# Patient Record
Sex: Male | Born: 1945 | Race: White | Hispanic: No | Marital: Married | Smoking: Former smoker
Health system: Southern US, Community
[De-identification: ages and names within clinical notes are randomized; demographics above are authoritative.]

## PROBLEM LIST (undated history)

## (undated) DIAGNOSIS — M545 Low back pain, unspecified: Secondary | ICD-10-CM

## (undated) DIAGNOSIS — E785 Hyperlipidemia, unspecified: Secondary | ICD-10-CM

## (undated) DIAGNOSIS — M1990 Unspecified osteoarthritis, unspecified site: Secondary | ICD-10-CM

## (undated) DIAGNOSIS — I639 Cerebral infarction, unspecified: Secondary | ICD-10-CM

## (undated) DIAGNOSIS — I10 Essential (primary) hypertension: Secondary | ICD-10-CM

## (undated) DIAGNOSIS — I723 Aneurysm of iliac artery: Secondary | ICD-10-CM

## (undated) DIAGNOSIS — F17210 Nicotine dependence, cigarettes, uncomplicated: Secondary | ICD-10-CM

## (undated) HISTORY — DX: Hyperlipidemia, unspecified: E78.5

## (undated) HISTORY — DX: Cerebral infarction, unspecified: I63.9

## (undated) HISTORY — DX: Unspecified osteoarthritis, unspecified site: M19.90

## (undated) HISTORY — DX: Low back pain, unspecified: M54.50

## (undated) HISTORY — DX: Essential (primary) hypertension: I10

## (undated) HISTORY — DX: Low back pain: M54.5

## (undated) HISTORY — DX: Aneurysm of iliac artery: I72.3

## (undated) HISTORY — DX: Nicotine dependence, cigarettes, uncomplicated: F17.210

---

## 2012-11-09 HISTORY — PX: CHOLECYSTECTOMY: SHX55

## 2017-09-06 ENCOUNTER — Other Ambulatory Visit: Payer: Self-pay

## 2017-09-06 DIAGNOSIS — I723 Aneurysm of iliac artery: Secondary | ICD-10-CM

## 2017-09-09 DIAGNOSIS — I723 Aneurysm of iliac artery: Secondary | ICD-10-CM

## 2017-09-09 HISTORY — DX: Aneurysm of iliac artery: I72.3

## 2017-09-15 ENCOUNTER — Encounter: Payer: Self-pay | Admitting: Vascular Surgery

## 2017-09-29 ENCOUNTER — Encounter: Payer: Self-pay | Admitting: Vascular Surgery

## 2017-09-29 ENCOUNTER — Ambulatory Visit (HOSPITAL_COMMUNITY)
Admission: RE | Admit: 2017-09-29 | Discharge: 2017-09-29 | Disposition: A | Discharge status: Home / Self Care | Payer: Medicare HMO | Source: Ambulatory Visit | Attending: Vascular Surgery | Admitting: Vascular Surgery

## 2017-09-29 ENCOUNTER — Ambulatory Visit (INDEPENDENT_AMBULATORY_CARE_PROVIDER_SITE_OTHER): Payer: Medicare HMO | Admitting: Vascular Surgery

## 2017-09-29 VITALS — BP 134/57 | HR 65 | Temp 98.1°F | Resp 16 | Ht 73.0 in | Wt 221.0 lb

## 2017-09-29 DIAGNOSIS — I713 Abdominal aortic aneurysm, ruptured, unspecified: Secondary | ICD-10-CM

## 2017-09-29 DIAGNOSIS — I714 Abdominal aortic aneurysm, without rupture, unspecified: Secondary | ICD-10-CM

## 2017-09-29 DIAGNOSIS — I723 Aneurysm of iliac artery: Secondary | ICD-10-CM

## 2017-09-29 NOTE — Progress Notes (Signed)
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  The consult is requested by Dr. Leona CarryImran.  HPI:   George Salazar is a pleasant 71 y.o. male, who underwent a screening ultrasound that showed a large right common iliac artery aneurysm and a small abdominal aortic aneurysm.  He is sent for vascular consultation.  He denies any family history of aneurysmal disease.  He denies any abdominal pain or back pain.  He is a smoker.  He denies any history of claudication, rest pain, or nonhealing ulcers.  I reviewed the ultrasound screening that was done elsewhere.  This showed that the maximum diameter of the infrarenal aorta was 3.2 cm.  The right common iliac artery measured 3.7 cm in maximal diameter, and the left common iliac artery 1.1 cm in diameter.  I reviewed the records that were sent from the referring office.  Patient was seen on 08/25/2017.  This was for an annual wellness check.  The hypertension was under good control.  Hyperlipidemia was also under good control.  Hemoglobin A1c was 5.5.  Creatinine was 1.9.  LDL was 64.  Past Medical History:  Diagnosis Date  . Hyperlipidemia   . Hypertension   . OA (osteoarthritis)     No family history on file.   There is no family history of aneurysmal disease.  There is no family history of premature cardiovascular disease.  SOCIAL HISTORY: He has smoked over a pack per day of cigarettes for many years but is cutting back now.  Social History   Socioeconomic History  . Marital status: Married    Spouse name: Not on file  . Number of children: Not on file  . Years of education: Not on file  . Highest education level: Not on file  Social Needs  . Financial resource strain: Not on file  . Food insecurity - worry: Not on file  . Food insecurity - inability: Not on file  . Transportation needs - medical: Not on file  . Transportation needs - non-medical: Not  on file  Occupational History  . Not on file  Tobacco Use  . Smoking status: Current Some Day Smoker  . Smokeless tobacco: Never Used  . Tobacco comment: 1 pk lasts 2 wks.  Substance and Sexual Activity  . Alcohol use: Not on file  . Drug use: Not on file  . Sexual activity: Not on file  Other Topics Concern  . Not on file  Social History Narrative  . Not on file    Allergies  Allergen Reactions  . Plavix [Clopidogrel Bisulfate]     Current Outpatient Medications  Medication Sig Dispense Refill  . FLUoxetine (PROZAC) 20 MG capsule Take 20 mg every morning by mouth.    Marland Kitchen. HYDROcodone-acetaminophen (NORCO) 10-325 MG tablet Take 1 tablet 3 (three) times daily by mouth.    . Multiple Vitamins-Minerals (CENTRUM ADULTS) TABS Take by mouth.    . Omega-3 Fatty Acids (FISH OIL) 1200 MG CAPS Take by mouth.    . ramipril (ALTACE) 5 MG capsule Take 5 mg daily by mouth.    . simvastatin (ZOCOR) 40 MG tablet Take 40 mg daily by mouth.    . Vitamin D, Ergocalciferol, (DRISDOL) 50000 units CAPS capsule Take 50,000 Units 2 (two) times a week by mouth.     No current facility-administered medications for this visit.     REVIEW OF SYSTEMS:  [X]   denotes positive finding, [ ]  denotes negative finding Cardiac  Comments:  Chest pain or chest pressure:    Shortness of breath upon exertion: X   Short of breath when lying flat:    Irregular heart rhythm: X       Vascular    Pain in calf, thigh, or hip brought on by ambulation:    Pain in feet at night that wakes you up from your sleep:     Blood clot in your veins:    Leg swelling:         Pulmonary    Oxygen at home:    Productive cough:  X   Wheezing:  X       Neurologic    Sudden weakness in arms or legs:     Sudden numbness in arms or legs:     Sudden onset of difficulty speaking or slurred speech:    Temporary loss of vision in one eye:     Problems with dizziness:  X       Gastrointestinal    Blood in stool:     Vomited  blood:         Genitourinary    Burning when urinating:     Blood in urine:        Psychiatric    Major depression:         Hematologic    Bleeding problems:    Problems with blood clotting too easily:        Skin    Rashes or ulcers:        Constitutional    Fever or chills:     PHYSICAL EXAM:   Vitals:   09/29/17 0942  BP: (!) 134/57  Pulse: 65  Resp: 16  Temp: 98.1 F (36.7 C)  TempSrc: Oral  SpO2: 98%  Weight: 221 lb (100.2 kg)  Height: 6\' 1"  (1.854 m)    GENERAL: The patient is a well-nourished male, in no acute distress. The vital signs are documented above. CARDIAC: There is a regular rate and rhythm.  VASCULAR: I do not detect carotid bruits. He has palpable femoral, popliteal, dorsalis pedis, posterior tibial pulses bilaterally. He has no significant lower extremity swelling. PULMONARY: There is good air exchange bilaterally without wheezing or rales. ABDOMEN: Soft and non-tender with normal pitched bowel sounds.  His aneurysm is nontender. MUSCULOSKELETAL: There are no major deformities or cyanosis. NEUROLOGIC: No focal weakness or paresthesias are detected. SKIN: There are no ulcers or rashes noted. PSYCHIATRIC: The patient has a normal affect.  DATA:    DUPLEX ABDOMINAL AORTA: I interpreted his duplex of the abdominal aorta done in our office today.  The maximum diameter of his infrarenal aorta is centimeters.  The maximum diameter of his right common iliac artery is 4.4 cm.  The maximum diameter of his left common iliac artery is 1.4 cm.  MEDICAL ISSUES:   4.4 CM ASYMPTOMATIC RIGHT COMMON ILIAC ARTERY ANEURYSM: This patient has an asymptomatic 4.4 cm right common iliac artery aneurysm.  Normally we would consider elective repair at 3.5 cm.  This reason I have ordered a CT angios of the abdomen and pelvis to further assess this aneurysm.  This will help [place] determine if he is a candidate for an endovascular repair and potentially an iliac branch device.   His blood pressures under good control.  I also had a long discussion with him about the importance of tobacco cessation.  He has a small abdominal aortic aneurysm  but we would likely address this at the same time as his iliac artery aneurysm.  I will see him back after his CT angiogram.  Waverly Ferrarihristopher Nakaiya Beddow [facility] [phone]

## 2017-10-04 ENCOUNTER — Other Ambulatory Visit: Payer: Self-pay

## 2017-10-04 NOTE — Addendum Note (Signed)
Addended by: Burton ApleyPETTY, Aziz Slape A on: 10/04/2017 09:19 AM   Modules accepted: Orders

## 2017-10-13 ENCOUNTER — Ambulatory Visit
Admission: RE | Admit: 2017-10-13 | Discharge: 2017-10-13 | Disposition: A | Discharge status: Home / Self Care | Payer: Medicare HMO | Source: Ambulatory Visit | Attending: Vascular Surgery | Admitting: Vascular Surgery

## 2017-10-13 ENCOUNTER — Encounter: Payer: Self-pay | Admitting: Vascular Surgery

## 2017-10-13 ENCOUNTER — Encounter: Payer: Self-pay | Admitting: *Deleted

## 2017-10-13 ENCOUNTER — Other Ambulatory Visit: Payer: Self-pay | Admitting: *Deleted

## 2017-10-13 ENCOUNTER — Ambulatory Visit: Payer: Medicare HMO | Admitting: Vascular Surgery

## 2017-10-13 VITALS — BP 107/62 | HR 76 | Temp 98.4°F | Resp 20 | Ht 73.0 in | Wt 222.0 lb

## 2017-10-13 DIAGNOSIS — I713 Abdominal aortic aneurysm, ruptured, unspecified: Secondary | ICD-10-CM

## 2017-10-13 DIAGNOSIS — I723 Aneurysm of iliac artery: Secondary | ICD-10-CM

## 2017-10-13 DIAGNOSIS — I714 Abdominal aortic aneurysm, without rupture, unspecified: Secondary | ICD-10-CM

## 2017-10-13 MED ORDER — IOPAMIDOL (ISOVUE-370) INJECTION 76%
100.0000 mL | Freq: Once | INTRAVENOUS | Status: AC | PRN
  Administered 2017-10-13: 100 mL via INTRAVENOUS

## 2017-10-13 NOTE — Progress Notes (Signed)
Patient name: George Salazar MRN: [id number] DOB: [birth year] Sex: male  REASON FOR VISIT:   Follow-up of 4.6 cm right iliac artery aneurysm.  HPI:   George Salazar is a pleasant 71 y.o. 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  I felt that this was a size that we would need to consider elective repair and therefore recommended a CT angiogram.  He comes back to discuss these results.  The patient had undergone a screening ultrasound that showed a large right common iliac artery aneurysm and a very small abdominal aortic aneurysm.  He denies any family history of aneurysmal disease.  He denies any abdominal pain.  He does have some chronic low back pain.  He has had no new symptoms since his last visit.  Past Medical History:  Diagnosis Date  . Hyperlipidemia   . Hypertension   . OA (osteoarthritis)     History reviewed. No pertinent family history.    SOCIAL HISTORY: Social History   Tobacco Use  . Smoking status: Current Some Day Smoker  . Smokeless tobacco: Never Used  . Tobacco comment: 1 pk lasts 2 wks.  Substance Use Topics  . Alcohol use: Not on file    Allergies  Allergen Reactions  . Plavix [Clopidogrel Bisulfate]     Current Outpatient Medications  Medication Sig Dispense Refill  . FLUoxetine (PROZAC) 20 MG capsule Take 20 mg every morning by mouth.    Marland Kitchen. HYDROcodone-acetaminophen (NORCO) 10-325 MG tablet Take 1 tablet 3 (three) times daily by mouth.    . Multiple Vitamins-Minerals (CENTRUM ADULTS) TABS Take by mouth.    . Omega-3 Fatty Acids (FISH OIL) 1200 MG CAPS Take by mouth.    . ramipril (ALTACE) 5 MG capsule Take 5 mg daily by mouth.    . simvastatin (ZOCOR) 40 MG tablet Take 40 mg daily by mouth.    . Vitamin D, Ergocalciferol, (DRISDOL) 50000 units CAPS capsule Take 50,000 Units 2 (two) times a week by mouth.     No current facility-administered medications for this visit.     REVIEW OF SYSTEMS:  [X]  denotes  positive finding, [ ]  denotes negative finding Cardiac  Comments:  Chest pain or chest pressure:    Shortness of breath upon exertion:    Short of breath when lying flat:    Irregular heart rhythm:        Vascular    Pain in calf, thigh, or hip brought on by ambulation:    Pain in feet at night that wakes you up from your sleep:     Blood clot in your veins:    Leg swelling:         Pulmonary    Oxygen at home:    Productive cough:     Wheezing:         Neurologic    Sudden weakness in arms or legs:     Sudden numbness in arms or legs:     Sudden onset of difficulty speaking or slurred speech:    Temporary loss of vision in one eye:     Problems with dizziness:         Gastrointestinal    Blood in stool:     Vomited blood:         Genitourinary    Burning when urinating:     Blood in urine:        Psychiatric    Major depression:  Hematologic    Bleeding problems:    Problems with blood clotting too easily:        Skin    Rashes or ulcers:        Constitutional    Fever or chills:     PHYSICAL EXAM:   Vitals:   10/13/17 1458  BP: 107/62  Pulse: 76  Resp: 20  Temp: 98.4 F (36.9 C)  TempSrc: Oral  SpO2: 96%  Weight: 222 lb (100.7 kg)  Height: 6\' 1"  (1.854 m)    GENERAL: The patient is a well-nourished male, in no acute distress. The vital signs are documented above. CARDIAC: There is a regular rate and rhythm.  VASCULAR: I do not detect carotid bruits. Patient has palpable femoral, popliteal, dorsalis pedis, posterior tibial pulses bilaterally. PULMONARY: There is good air exchange bilaterally without wheezing or rales. ABDOMEN: Soft and non-tender with normal pitched bowel sounds.  MUSCULOSKELETAL: There are no major deformities or cyanosis. NEUROLOGIC: No focal weakness or paresthesias are detected. SKIN: There are no ulcers or rashes noted. PSYCHIATRIC: The patient has a normal affect.  DATA:    CT ANGIOGRAM ABDOMEN PELVIS: The  maximum diameter of the right common iliac artery aneurysm is 4.4 cm.  The maximum diameter of the infrarenal aorta is 2.8 cm.  There is significant calcium in the right internal iliac artery and this is somewhat small.  The left common iliac artery is not aneurysmal.  MEDICAL ISSUES:   4.4 CM RIGHT COMMON ILIAC ARTERY ANEURYSM: This patient has a 4.4 cm right common iliac artery aneurysm.  I have explained that we would normally recommend elective repair at 3.5 cm for an iliac artery aneurysm.  This reason I have recommended elective repair.  The aneurysm extends up to the aortic bifurcation so this would have to be repaired with an aortoiliac endovascular stent graft.  I do not think that he is a good candidate for iliac branch device on the right given that the internal iliac artery is small and severely calcified.  I have recommended that we coil embolized this first and then proceed with staged endovascular repair of his aneurysm after he obtains cardiac clearance.  My other concern is that the left common iliac artery is not especially large and hopefully will have enough landing zone therefore the contralateral limb in order to preserve flow into the hypogastric artery on the left.  I scheduled him for coil embolization of the right internal iliac artery on 10/18/2017.  We have discussed the procedure and potential complications and he is agreeable to proceed.  Once he has had cardiac clearance we can plan elective endovascular repair of his aneurysm.  Waverly Ferrarihristopher Markitta Ausburn [facility] [phone]

## 2017-10-13 NOTE — H&P (View-Only) (Signed)
Patient name: George Salazar MRN: [id number] DOB: [birth year] Sex: male  REASON FOR VISIT:   Follow-up of 4.6 cm right iliac artery aneurysm.  HPI:   George Salazar is a pleasant 71 y.o. 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  I felt that this was a size that we would need to consider elective repair and therefore recommended a CT angiogram.  He comes back to discuss these results.  The patient had undergone a screening ultrasound that showed a large right common iliac artery aneurysm and a very small abdominal aortic aneurysm.  He denies any family history of aneurysmal disease.  He denies any abdominal pain.  He does have some chronic low back pain.  He has had no new symptoms since his last visit.  Past Medical History:  Diagnosis Date  . Hyperlipidemia   . Hypertension   . OA (osteoarthritis)     History reviewed. No pertinent family history.    SOCIAL HISTORY: Social History   Tobacco Use  . Smoking status: Current Some Day Smoker  . Smokeless tobacco: Never Used  . Tobacco comment: 1 pk lasts 2 wks.  Substance Use Topics  . Alcohol use: Not on file    Allergies  Allergen Reactions  . Plavix [Clopidogrel Bisulfate]     Current Outpatient Medications  Medication Sig Dispense Refill  . FLUoxetine (PROZAC) 20 MG capsule Take 20 mg every morning by mouth.    Marland Kitchen. HYDROcodone-acetaminophen (NORCO) 10-325 MG tablet Take 1 tablet 3 (three) times daily by mouth.    . Multiple Vitamins-Minerals (CENTRUM ADULTS) TABS Take by mouth.    . Omega-3 Fatty Acids (FISH OIL) 1200 MG CAPS Take by mouth.    . ramipril (ALTACE) 5 MG capsule Take 5 mg daily by mouth.    . simvastatin (ZOCOR) 40 MG tablet Take 40 mg daily by mouth.    . Vitamin D, Ergocalciferol, (DRISDOL) 50000 units CAPS capsule Take 50,000 Units 2 (two) times a week by mouth.     No current facility-administered medications for this visit.     REVIEW OF SYSTEMS:  [X]  denotes  positive finding, [ ]  denotes negative finding Cardiac  Comments:  Chest pain or chest pressure:    Shortness of breath upon exertion:    Short of breath when lying flat:    Irregular heart rhythm:        Vascular    Pain in calf, thigh, or hip brought on by ambulation:    Pain in feet at night that wakes you up from your sleep:     Blood clot in your veins:    Leg swelling:         Pulmonary    Oxygen at home:    Productive cough:     Wheezing:         Neurologic    Sudden weakness in arms or legs:     Sudden numbness in arms or legs:     Sudden onset of difficulty speaking or slurred speech:    Temporary loss of vision in one eye:     Problems with dizziness:         Gastrointestinal    Blood in stool:     Vomited blood:         Genitourinary    Burning when urinating:     Blood in urine:        Psychiatric    Major depression:  Hematologic    Bleeding problems:    Problems with blood clotting too easily:        Skin    Rashes or ulcers:        Constitutional    Fever or chills:     PHYSICAL EXAM:   Vitals:   10/13/17 1458  BP: 107/62  Pulse: 76  Resp: 20  Temp: 98.4 F (36.9 C)  TempSrc: Oral  SpO2: 96%  Weight: 222 lb (100.7 kg)  Height: 6' 1" (1.854 m)    GENERAL: The patient is a well-nourished male, in no acute distress. The vital signs are documented above. CARDIAC: There is a regular rate and rhythm.  VASCULAR: I do not detect carotid bruits. Patient has palpable femoral, popliteal, dorsalis pedis, posterior tibial pulses bilaterally. PULMONARY: There is good air exchange bilaterally without wheezing or rales. ABDOMEN: Soft and non-tender with normal pitched bowel sounds.  MUSCULOSKELETAL: There are no major deformities or cyanosis. NEUROLOGIC: No focal weakness or paresthesias are detected. SKIN: There are no ulcers or rashes noted. PSYCHIATRIC: The patient has a normal affect.  DATA:    CT ANGIOGRAM ABDOMEN PELVIS: The  maximum diameter of the right common iliac artery aneurysm is 4.4 cm.  The maximum diameter of the infrarenal aorta is 2.8 cm.  There is significant calcium in the right internal iliac artery and this is somewhat small.  The left common iliac artery is not aneurysmal.  MEDICAL ISSUES:   4.4 CM RIGHT COMMON ILIAC ARTERY ANEURYSM: This patient has a 4.4 cm right common iliac artery aneurysm.  I have explained that we would normally recommend elective repair at 3.5 cm for an iliac artery aneurysm.  This reason I have recommended elective repair.  The aneurysm extends up to the aortic bifurcation so this would have to be repaired with an aortoiliac endovascular stent graft.  I do not think that he is a good candidate for iliac branch device on the right given that the internal iliac artery is small and severely calcified.  I have recommended that we coil embolized this first and then proceed with staged endovascular repair of his aneurysm after he obtains cardiac clearance.  My other concern is that the left common iliac artery is not especially large and hopefully will have enough landing zone therefore the contralateral limb in order to preserve flow into the hypogastric artery on the left.  I scheduled him for coil embolization of the right internal iliac artery on 10/18/2017.  We have discussed the procedure and potential complications and he is agreeable to proceed.  Once he has had cardiac clearance we can plan elective endovascular repair of his aneurysm.  [name] [facility] [place] [facility] [phone] 

## 2017-10-18 ENCOUNTER — Encounter (HOSPITAL_COMMUNITY)
Admission: RE | Disposition: A | Discharge status: Home / Self Care | Payer: Self-pay | Source: Ambulatory Visit | Attending: Vascular Surgery

## 2017-10-18 ENCOUNTER — Encounter (HOSPITAL_COMMUNITY): Payer: Self-pay | Admitting: Vascular Surgery

## 2017-10-18 ENCOUNTER — Ambulatory Visit (HOSPITAL_COMMUNITY)
Admission: RE | Admit: 2017-10-18 | Discharge: 2017-10-18 | Disposition: A | Discharge status: Home / Self Care | Payer: Medicare HMO | Source: Ambulatory Visit | Attending: Vascular Surgery | Admitting: Vascular Surgery

## 2017-10-18 DIAGNOSIS — E785 Hyperlipidemia, unspecified: Secondary | ICD-10-CM | POA: Insufficient documentation

## 2017-10-18 DIAGNOSIS — I714 Abdominal aortic aneurysm, without rupture: Secondary | ICD-10-CM

## 2017-10-18 DIAGNOSIS — G8929 Other chronic pain: Secondary | ICD-10-CM | POA: Diagnosis not present

## 2017-10-18 DIAGNOSIS — M545 Low back pain: Secondary | ICD-10-CM | POA: Insufficient documentation

## 2017-10-18 DIAGNOSIS — I10 Essential (primary) hypertension: Secondary | ICD-10-CM | POA: Insufficient documentation

## 2017-10-18 DIAGNOSIS — M1990 Unspecified osteoarthritis, unspecified site: Secondary | ICD-10-CM | POA: Diagnosis not present

## 2017-10-18 DIAGNOSIS — I723 Aneurysm of iliac artery: Secondary | ICD-10-CM | POA: Insufficient documentation

## 2017-10-18 DIAGNOSIS — F17200 Nicotine dependence, unspecified, uncomplicated: Secondary | ICD-10-CM | POA: Diagnosis not present

## 2017-10-18 HISTORY — PX: EMBOLIZATION: CATH118239

## 2017-10-18 LAB — POCT I-STAT, CHEM 8
BUN: 22 mg/dL — AB (ref 6–20)
CHLORIDE: 108 mmol/L (ref 101–111)
CREATININE: 1.1 mg/dL (ref 0.61–1.24)
Calcium, Ion: 1.25 mmol/L (ref 1.15–1.40)
GLUCOSE: 104 mg/dL — AB (ref 65–99)
HCT: 37 % — ABNORMAL LOW (ref 39.0–52.0)
Hemoglobin: 12.6 g/dL — ABNORMAL LOW (ref 13.0–17.0)
POTASSIUM: 4.6 mmol/L (ref 3.5–5.1)
Sodium: 143 mmol/L (ref 135–145)
TCO2: 24 mmol/L (ref 22–32)

## 2017-10-18 LAB — POCT ACTIVATED CLOTTING TIME: Activated Clotting Time: 142 seconds

## 2017-10-18 SURGERY — EMBOLIZATION
Anesthesia: LOCAL

## 2017-10-18 MED ORDER — HYDRALAZINE HCL 20 MG/ML IJ SOLN: 5.0000 mg | INTRAMUSCULAR | Status: DC | PRN

## 2017-10-18 MED ORDER — MIDAZOLAM HCL 2 MG/2ML IJ SOLN
INTRAMUSCULAR | Status: AC
  Filled 2017-10-18: qty 2

## 2017-10-18 MED ORDER — LIDOCAINE HCL (PF) 1 % IJ SOLN
INTRAMUSCULAR | Status: DC | PRN
  Administered 2017-10-18: 15 mL

## 2017-10-18 MED ORDER — IODIXANOL 320 MG/ML IV SOLN
INTRAVENOUS | Status: DC | PRN
  Administered 2017-10-18: 70 mL via INTRA_ARTERIAL

## 2017-10-18 MED ORDER — LABETALOL HCL 5 MG/ML IV SOLN: 10.0000 mg | INTRAVENOUS | Status: DC | PRN

## 2017-10-18 MED ORDER — SODIUM CHLORIDE 0.9 % WEIGHT BASED INFUSION: 1.0000 mL/kg/h | INTRAVENOUS | Status: DC

## 2017-10-18 MED ORDER — SODIUM CHLORIDE 0.9% FLUSH: 3.0000 mL | Freq: Two times a day (BID) | INTRAVENOUS | Status: DC

## 2017-10-18 MED ORDER — SODIUM CHLORIDE 0.9 % IV SOLN
INTRAVENOUS | Status: DC
  Administered 2017-10-18: 07:00:00 via INTRAVENOUS

## 2017-10-18 MED ORDER — HEPARIN (PORCINE) IN NACL 2-0.9 UNIT/ML-% IJ SOLN
INTRAMUSCULAR | Status: AC
  Filled 2017-10-18: qty 1000

## 2017-10-18 MED ORDER — MIDAZOLAM HCL 2 MG/2ML IJ SOLN
INTRAMUSCULAR | Status: DC | PRN
  Administered 2017-10-18: 1 mg via INTRAVENOUS

## 2017-10-18 MED ORDER — SODIUM CHLORIDE 0.9% FLUSH: 3.0000 mL | INTRAVENOUS | Status: DC | PRN

## 2017-10-18 MED ORDER — FENTANYL CITRATE (PF) 100 MCG/2ML IJ SOLN
INTRAMUSCULAR | Status: DC | PRN
  Administered 2017-10-18: 50 ug via INTRAVENOUS

## 2017-10-18 MED ORDER — SODIUM CHLORIDE 0.9 % IV SOLN: 250.0000 mL | INTRAVENOUS | Status: DC | PRN

## 2017-10-18 MED ORDER — HEPARIN (PORCINE) IN NACL 2-0.9 UNIT/ML-% IJ SOLN
INTRAMUSCULAR | Status: AC | PRN
  Administered 2017-10-18: 1000 mL via INTRA_ARTERIAL

## 2017-10-18 MED ORDER — LIDOCAINE HCL (PF) 1 % IJ SOLN
INTRAMUSCULAR | Status: AC
  Filled 2017-10-18: qty 30

## 2017-10-18 MED ORDER — HEPARIN SODIUM (PORCINE) 1000 UNIT/ML IJ SOLN
INTRAMUSCULAR | Status: DC | PRN
  Administered 2017-10-18: 4000 [IU] via INTRAVENOUS

## 2017-10-18 MED ORDER — FENTANYL CITRATE (PF) 100 MCG/2ML IJ SOLN
INTRAMUSCULAR | Status: AC
  Filled 2017-10-18: qty 2

## 2017-10-18 SURGICAL SUPPLY — 22 items
CATH ACCU-VU SIZ PIG 5F 70CM (CATHETERS) ×2 IMPLANT
CATH ANGIO 5F BER2 65CM (CATHETERS) ×2 IMPLANT
CATH QUICKCROSS SUPP .035X90CM (MICROCATHETER) ×2 IMPLANT
CATH SOFT-VU 4F 65 STRAIGHT (CATHETERS) ×1 IMPLANT
CATH SOFT-VU STRAIGHT 4F 65CM (CATHETERS) ×1
CATH STRAIGHT 5FR 65CM (CATHETERS) ×2 IMPLANT
CATH TEMPO 5F RIM 65CM (CATHETERS) ×2 IMPLANT
COIL NESTER 14X6 (Vascular Products) ×2 IMPLANT
COIL NESTER 14X8 (Vascular Products) ×6 IMPLANT
COVER PRB 48X5XTLSCP FOLD TPE (BAG) ×1 IMPLANT
COVER PROBE 5X48 (BAG) ×1
DEVICE TORQUE .025-.038 (MISCELLANEOUS) ×2 IMPLANT
GUIDEWIRE ANGLED .035X150CM (WIRE) ×2 IMPLANT
KIT MICROINTRODUCER STIFF 5F (SHEATH) ×2 IMPLANT
KIT PV (KITS) ×2 IMPLANT
SHEATH FLEX ANSEL ANG 5F 45CM (SHEATH) ×2 IMPLANT
SHEATH PINNACLE 5F 10CM (SHEATH) ×2 IMPLANT
SYR MEDRAD MARK V 150ML (SYRINGE) ×2 IMPLANT
TRANSDUCER W/STOPCOCK (MISCELLANEOUS) ×2 IMPLANT
TRAY PV CATH (CUSTOM PROCEDURE TRAY) ×2 IMPLANT
WIRE BENTSON .035X145CM (WIRE) ×2 IMPLANT
WIRE ROSEN-J .035X180CM (WIRE) ×2 IMPLANT

## 2017-10-18 NOTE — Op Note (Signed)
PATIENT: George Salazar      MRN: [id number] DOB: 02/17/1946    DATE OF PROCEDURE: 10/18/2017  INDICATIONS:    George Salazar is a 71 y.o. male with a  4.4 cm right common iliac artery aneurysm.  In anticipation of endovascular aneurysm repair, who presents for coil embolization of the right hypogastric artery.  PROCEDURE:    1.  Bilateral ultrasound-guided common femoral access 2.  Conscious sedation 3.  Aortogram with bilateral iliac arteriogram 4.  Selective catheterization of the right hypogastric artery with arteriogram of the right hypogastric artery 5.  Coil embolization of the right hypogastric artery  SURGEON: Di Kindlehristopher S. Edilia Boickson, MD, FACS  ANESTHESIA: Local with sedation  EBL: Minimal  TECHNIQUE: The patient was brought to the peripheral vascular lab and was sedated. The period of conscious sedation was 89 minutes.  During that time period, I was present face-to-face 100% of the time.  The patient was administered 1 mg of Versed and 50 mcg of fentanyl. The patient's heart rate, blood pressure, and oxygen saturation were monitored by the nurse continuously during the procedure.   Both groins were prepped and draped in usual sterile fashion.  Under ultrasound guidance, after the skin was anesthetized, the right common femoral artery was cannulated with a micropuncture needle and a micropuncture sheath introduced over the wire.  This was exchanged for a 5 [place] sheath.  I then advanced the Bentson wire and placed a pigtail catheter at the L1 vertebral body.  Flush aortogram obtained.  The catheter was positioned above the aortic bifurcation and an oblique iliac projection was obtained.  I then exchanged the pigtail catheter for a rim catheter and tried to cannulate the hypogastric artery.  I was ultimately able to cannulate the hypogastric artery but tried several catheters and was unable to advance the catheter into the hypogastric artery because of its angulation.  I  therefore elected to use a left-sided approach.  Under ultrasound guidance, after the skin was anesthetized, the left common femoral artery was cannulated with a micropuncture needle and a micropuncture sheath introduced over a wire.  This was exchanged for a long 5 [place] sheath over a Bentson wire.  A rim catheter was positioned across the aortic bifurcation and the wire advanced down to the external iliac artery.  The sheath was then advanced into the common iliac artery.  I then used the Berenstein 2 catheter to cannulate the hypogastric artery the wire was advanced into the hypogastric artery and I was ultimately able to advanced a straight catheter into the distal hypogastric artery.  I then placed 3 8 Nester coils and 1 6 next her coil.  Completion film showed the coils in excellent position with maintenance maintenance of the distal collaterals.  In order to help determine the size of his endovascular graft and then placed the pigtail catheter up from the left side with a slightly RAO projection to measure the distance to the hypogastric artery on the left.  This catheter was then removed over a wire.  Patient was transferred to the holding area for removal of the sheath.  No immediate complications were noted.   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

## 2017-10-18 NOTE — Discharge Instructions (Signed)

## 2017-10-18 NOTE — Progress Notes (Signed)
Site area: left groin fa sheath Site Prior to Removal:  Level 0 Pressure Applied For:  20 minutes Manual:   yes Patient Status During Pull:  stable Post Pull Site:  Level  0 Post Pull Instructions Given:  yes Post Pull Pulses Present: palpable Dressing Applied:  Gauze and tegaderm Bedrest begins @ 1020 Comments:

## 2017-10-18 NOTE — Interval H&P Note (Signed)
History and Physical Interval Note:  10/18/2017 7:38 AM  George Salazar  has presented today for surgery, with the diagnosis of AAA  The various methods of treatment have been discussed with the patient and family. After consideration of risks, benefits and other options for treatment, the patient has consented to  Procedure(s): EMBOLIZATION (N/A) as a surgical intervention .  The patient's history has been reviewed, patient examined, no change in status, stable for surgery.  I have reviewed the patient's chart and labs.  Questions were answered to the patient's satisfaction.     Waverly Ferrarihristopher Joandy Burget

## 2017-10-18 NOTE — Progress Notes (Signed)
Site area: rt groin fa sheath Site Prior to Removal:  Level 0 Pressure Applied For: 20 minutes Manual:    yes Patient Status During Pull:  stable  Post Pull Site:  Level 0 Post Pull Instructions Given:  yes Post Pull Pulses Present: palpable Dressing Applied:  Gauze and tegaderm Bedrest begins @  Comments:   

## 2017-10-28 ENCOUNTER — Ambulatory Visit: Payer: Medicare HMO | Admitting: Cardiology

## 2017-10-28 ENCOUNTER — Encounter: Payer: Self-pay | Admitting: Cardiology

## 2017-10-28 VITALS — BP 115/62 | HR 75 | Ht 73.0 in | Wt 222.0 lb

## 2017-10-28 DIAGNOSIS — I724 Aneurysm of artery of lower extremity: Secondary | ICD-10-CM

## 2017-10-28 DIAGNOSIS — E785 Hyperlipidemia, unspecified: Secondary | ICD-10-CM | POA: Diagnosis not present

## 2017-10-28 DIAGNOSIS — Z01810 Encounter for preprocedural cardiovascular examination: Secondary | ICD-10-CM | POA: Diagnosis not present

## 2017-10-28 DIAGNOSIS — Z8673 Personal history of transient ischemic attack (TIA), and cerebral infarction without residual deficits: Secondary | ICD-10-CM | POA: Diagnosis not present

## 2017-10-28 DIAGNOSIS — I10 Essential (primary) hypertension: Secondary | ICD-10-CM | POA: Diagnosis not present

## 2017-10-28 DIAGNOSIS — R0609 Other forms of dyspnea: Secondary | ICD-10-CM | POA: Diagnosis not present

## 2017-10-28 DIAGNOSIS — F17210 Nicotine dependence, cigarettes, uncomplicated: Secondary | ICD-10-CM

## 2017-10-28 DIAGNOSIS — I723 Aneurysm of iliac artery: Secondary | ICD-10-CM | POA: Diagnosis not present

## 2017-10-28 HISTORY — DX: Nicotine dependence, cigarettes, uncomplicated: F17.210

## 2017-10-28 NOTE — Progress Notes (Signed)
PCP: George Ammons, MD  Vascular Sgx: Dr. Edilia Salazar.  Clinic Note: Chief Complaint  Patient presents with  . New Patient (Initial Visit)    CARDIAC CLEARANCE  . PAD    R Iliac Aneurysm    HPI: George Salazar is a 71 y.o. male with a PMH below who presents today for Pre-operative Cardiac Evaluation at the request of George Ammons, MD & Dr. Cari Salazar. 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 - was told he had a CVA - as a result of taking Plavix??   CRFs - PAD/Aortic Atherosclerosis, HLD, HTN.  George Salazar was last seen on 10/13/2017 by Dr. Edilia Salazar --> this was in follow-up for initial evaluation in November he is not had a 4.6 cm right common femoral iliac aneurysm.  He had an ultrasound screening that showed that it was very large but a small abdominal aortic aneurysm.  He subsequently underwent coil embolization of the right hypogastric/internal iliac artery with plans for endovascular treatment of the common iliac vessel.He has been sent for cardiac preoperative risk assessment.  There is no guarantee that this will be able to be done endovascularly.  Recent Hospitalizations: Outpatient procedure  Studies Personally Reviewed - (if available, images/films reviewed: From Epic Chart or Care Everywhere)  10/18/2017: Abdominal iliac angiogram followed by coil embolization of the right "hypogastric "artery  Interval History: George Salazar presents here for cardiology evaluation.  Very difficult to get history from him.  His wife interrupts most the time, because he talks very slow and seems to be very non-forthcomingIt is hard to determine what sounds like his speech and responsiveness, and ability to answer questions was dramatically limited by an episode that was called a stroke or mini stroke a few years ago.  I could not get any details  from them, and I do not see anything in the chart.  Basically his activity is totally limited by right hip pain that really limits his walking.  It is unclear if this is related to claudication symptoms or simply musculoskeletal pain. As a result of either his pain or simply deconditioning and long-term smoking/COPD he is unable to keep up with his wife going up stairs.  He can barely walk around the house.  He does try to walk his dog, doing anything more than that which includes walking to the mailbox he gets short of breath. He denies any chest tightness or pressure with rest or exertion, but notably does not exert himself.  He denies any PND or orthopnea, but he does sleep sitting up a bit which she has done for long time.   Besides being limited by shortness of breath claudication chest pain or pressure. No rapid irregular heartbeats or palpitations.  He may get lightheaded/dizzy if he gets short of breath, but he denies any syncope/near syncope or TIA/amaurosis fugax symptoms.  He has chronic daily morning cough.  Productive of some whitish sputum, but not as yellow-green sputum.  Likely noninfectious  ROS: A comprehensive was performed. Review of Systems  Constitutional: Positive for malaise/fatigue.  HENT: Positive for congestion.   Respiratory: Positive for cough and shortness of breath.   Gastrointestinal: Negative for abdominal pain, blood in stool, melena and nausea.  Genitourinary: Negative for hematuria.  Musculoskeletal: Positive for joint pain (Right hip and back).  Neurological: Positive for dizziness.       Slow,  deliberate speech  Psychiatric/Behavioral: Positive for depression and memory loss.  All other systems reviewed and are negative.   I have reviewed and (if needed) personally updated the patient's problem list, medications, allergies, past medical and surgical history, social and family history.   Past Medical History:  Diagnosis Date  . Aneurysm of right  common iliac artery (HCC) 09/2017  . Cigarette smoker 10/28/2017  . Hyperlipidemia   . Hypertension   . OA (osteoarthritis)   . Stroke [facility](HCC)    Unclear of details    Past Surgical History:  Procedure Laterality Date  . CHOLECYSTECTOMY  2014  . EMBOLIZATION N/A 10/18/2017   Procedure: EMBOLIZATION;  Surgeon: Chuck Hintickson, Christopher S, MD;  [facility];  Service: Cardiovascular;  Laterality: N/A;  Right Internal Iliac Artery    Current Meds  Medication Sig  . aspirin EC 81 MG tablet Take 81 mg by mouth daily.  . citalopram (CELEXA) 20 MG tablet Take 20 mg by mouth daily.  . diphenhydramine-acetaminophen (TYLENOL PM) 25-500 MG TABS tablet Take 2 tablets by mouth at bedtime.  Marland Kitchen. FLUoxetine (PROZAC) 20 MG capsule Take 20 mg by mouth daily.   Marland Kitchen. HYDROcodone-acetaminophen (NORCO) 10-325 MG tablet Take 1 tablet by mouth 3 (three) times daily as needed for moderate pain.   . Multiple Vitamins-Minerals (CENTRUM ADULTS) TABS Take 1 tablet by mouth daily.   . Omega-3 Fatty Acids (FISH OIL) 1200 MG CAPS Take 1,200 mg by mouth daily.   . ramipril (ALTACE) 5 MG capsule Take 5 mg daily by mouth.  . simvastatin (ZOCOR) 40 MG tablet Take 40 mg daily by mouth.  . Vitamin D, Ergocalciferol, (DRISDOL) 50000 units CAPS capsule Take 50,000 Units 2 (two) times a week by mouth.    Allergies  Allergen Reactions  . Plavix [Clopidogrel Bisulfate]     Passed out, redness     Social History   Tobacco Use  . Smoking status: Current Some Day Smoker    Types: Cigarettes  . Smokeless tobacco: Never Used  . Tobacco comment: 1 pk lasts 2 wks.  Substance Use Topics  . Alcohol use: Yes    Alcohol/week: 1.8 - 2.4 oz    Types: 3 - 4 Cans of beer per week    Comment: DAILY  . Drug use: No   Social History   Social History Narrative   He is a former heavy smoker, who is now smoking 1-2 cigarettes a day.  Prior to that he smoked for 65 years.   He walks his dog, but otherwise inactive.  He is  married with 2 children-accompanied by his wife today.      He pretty much is not aware of his siblings medical history.  He has 2 brothers and a sister    family history includes Cancer in his mother; Stroke (age of onset: 1280) in his father.  Wt Readings from Last 3 Encounters:  10/28/17 222 lb (100.7 kg)  10/13/17 222 lb (100.7 kg)  09/29/17 221 lb (100.2 kg)    PHYSICAL EXAM BP 115/62 (BP Location: Right Arm)   Pulse 75   Ht 6\' 1"  (1.854 m)   Wt 222 lb (100.7 kg)   BMI 29.29 kg/m   Physical Exam  Constitutional: He is oriented to person, place, and time. He appears well-developed and well-nourished. No distress.  HENT:  Head: Normocephalic and atraumatic.  Mouth/Throat: No oropharyngeal exudate.  Eyes: EOM are normal. Pupils are equal, round, and reactive to light. No scleral  icterus.  Neck: Neck supple. No hepatojugular reflux and no JVD present. Carotid bruit is present (Possible faint bruits, but difficult to tell because he did not hold his breath).  Cardiovascular: Normal rate and regular rhythm.  No extrasystoles are present. Exam reveals distant heart sounds. Exam reveals no gallop and no friction rub. Decreased pulses: Does have somewhat diminished pedal pulses.  No murmur heard. Pulmonary/Chest: Effort normal. No respiratory distress.  Diminished breath sounds bilaterally with coarse rhonchi and expiratory wheezing.  Clears somewhat with cough.  Abdominal: Soft. Bowel sounds are normal. He exhibits no distension. There is no tenderness. There is no rebound.  Musculoskeletal: Normal range of motion. He exhibits no edema.  Neurological: He is alert and oriented to person, place, and time. No cranial nerve deficit.  Skin: Skin is warm and dry. No erythema.  Psychiatric: He has a normal mood and affect. His behavior is normal. Judgment and thought content normal.  Nursing note and vitals reviewed.    Adult ECG Report  From 10/18/2017 --  Sinus bradycardia with rate of  53 bpm.Otherwise normal axis, intervals and durations.  Other studies Reviewed: Additional studies/ records that were reviewed today include:  Recent Labs:    No results found for: CHOL, HDL, LDLCALC, LDLDIRECT, TRIG, CHOLHDL Lab Results  Component Value Date   CREATININE 1.10 10/18/2017   BUN 22 (H) 10/18/2017   NA 143 10/18/2017   K 4.6 10/18/2017   CL 108 10/18/2017    ASSESSMENT / PLAN: Problem List Items Addressed This Visit    Aneurysm of right common iliac artery (HCC)   Cigarette smoker (Chronic)    He has cut back of late, but is still dealing with 65-year history.  I would not be surprised if he has COPD and clearly has this is a risk factor for CAD as well as PAD.      Relevant Orders   MYOCARDIAL PERFUSION IMAGING   Dyspnea on minimal exertion    This is a limiting feature for him.  This plus his right leg pain as a result of not fully able to assess his cardiac symptoms and therefore I think it is prudent to proceed with stress test. Lexiscan Myoview.  He is not actively wheezing.      Relevant Orders   MYOCARDIAL PERFUSION IMAGING   Essential hypertension (Chronic)    Well-controlled on ACE inhibitor.      Femoral artery aneurysm, right (HCC) (Chronic)   Relevant Orders   MYOCARDIAL PERFUSION IMAGING   History of CVA (cerebrovascular accident) (Chronic)   Hyperlipidemia with target LDL less than 70 (Chronic)    Statin -lipid levels are managed by PCP.  I do not have his labs. -Target LDL should be less than 70 now.      Preoperative cardiovascular examination - Primary    Jai has a history of hypertension and hyperlipidemia long-term smoking, likely COPD based on his clinical history.  He has PAD as witnessed by his iliac aneurysm. Apparently he has had a stroke as well.  He does not have diabetes and has not been diagnosed as yet with any.  He denies any heart failure symptoms.  PREOPERATIVE CARDIAC RISK ASSESSMENT   Revised Cardiac Risk  Index:  High Risk Surgery: no; could be higher risk if he goes from endovascular to open  Defined as Intraperitoneal, intrathoracic or suprainguinal vascular  Active CAD: no; as yet undiagnosed, but denies any anginal symptoms.  His activity is limited by dyspnea and more so  by arthritis and claudication.  CHF: no; no active CHF symptoms  Cerebrovascular Disease: yes; reported history of stroke although I do not see details  Diabetes: no; On Insulin: no  CKD (Cr >~ 2): no; creatinine 1.1  Total: If endovascular, only one, if open, 2. Estimated Risk of Adverse Outcome: Without further information would be low to intermediate Estimated Risk of MI, PE, VF/VT (Cardiac Arrest), Complete Heart Block: 1-3 %   ACC/AHA Guidelines for "Clearance":  Step 1 - Need for Emergency Surgery: No: But relatively urgent  If Yes - go straight to OR with perioperative surveillance  Step 2 - Active Cardiac Conditions (Unstable Angina, Decompensated HF, Significant  Arrhytmias - Complete HB, Mobitz II, Symptomatic VT or SVT, Severe Aortic Stenosis - mean gradient > 40 mmHg, Valve area < 1.0 cm2):   No: He denies any significant angina or heart failure symptoms, however he is so deconditioned and unable to walk because of arthritis pains I cannot fully assess  If Yes - Evaluate & Treat per ACC/AHA Guidelines  Step 3 -  Low Risk Surgery: No: Only if open  If Yes --> proceed to OR  If No --> Step 4  Step 4 - Functional Capacity >= 4 METS without symptoms: No: Unable to walk more than 75 feet at most.  This is not necessarily related to cardiac issues.  If Yes --> proceed to OR  If No --> Step 5  Step 5 --  Clinical Risk Factors (CRF)   1-2 or more CRFs: Yes --since we cannot fully determine if he does actively have cardiac symptoms based on his lack of activity, it is not unreasonable to proceed with a stress test.  I would prefer not to delay surgery, however endovascular procedure could be done  on Plavix if necessary.   At this point, I think is probably reasonable to evaluate for cardiac ischemia with a nuclear stress test.  He would not be able to walk on a treadmill, therefore we will do a Lexiscan I would only react to a intermediate or high risk Myoview result.  If it is low risk, would recommend proceeding with surgery.  He is on statin, not beta-blocker.  Depending on what we find on the stress test would potentially recommend adding beta-blocker       Relevant Orders   MYOCARDIAL PERFUSION IMAGING      Current medicines are reviewed at length with the patient today. (+/- concerns) n/a The following changes have been made: n/a  Patient Instructions  No MEDICATION CHANGES      Your physician wants you to follow-up  AFTER TEST IS COMPLETED    TESTING [address] Your physician has requested that you have a lexiscan myoview. For further information please visit [email or web address] Please follow instruction sheet, as given.  Cardiac Nuclear Scan A cardiac nuclear scan is a test that measures blood flow to the heart when a person is resting and when he or she is exercising. The test looks for problems such as:  Not enough blood reaching a portion of the heart.  The heart muscle not working normally.  You may need this test if:  You have heart disease.  You have had abnormal lab results.  You have had heart surgery or angioplasty.  You have chest pain.  You have shortness of breath.  In this test, a radioactive dye (tracer) is injected into your bloodstream. After the tracer has traveled to your heart, an imaging device  is used to measure how much of the tracer is absorbed by or distributed to various areas of your heart. This procedure is usually done at a hospital and takes 2-4 hours. Tell a health care provider about:  Any allergies you have.  All medicines you are taking, including vitamins, herbs, eye drops, creams, and  over-the-counter medicines.  Any problems you or family members have had with the use of anesthetic medicines.  Any blood disorders you have.  Any surgeries you have had.  Any medical conditions you have.  Whether you are pregnant or may be pregnant. What are the risks? Generally, this is a safe procedure. However, problems may occur, including:  Serious chest pain and heart attack. This is only a risk if the stress portion of the test is done.  Rapid heartbeat.  Sensation of warmth in your chest. This usually passes quickly.  What happens before the procedure?  Ask your health care provider about changing or stopping your regular medicines. This is especially important if you are taking diabetes medicines or blood thinners.  Remove your jewelry on the day of the procedure. What happens during the procedure?  An IV tube will be inserted into one of your veins.  Your health care provider will inject a small amount of radioactive tracer through the tube.  You will wait for 20-40 minutes while the tracer travels through your bloodstream.  Your heart activity will be monitored with an electrocardiogram (ECG).  You will lie down on an exam table.  Images of your heart will be taken for about 15-20 minutes.  You may be asked to exercise on a treadmill or stationary bike. While you exercise, your heart's activity will be monitored with an ECG, and your blood pressure will be checked. If you are unable to exercise, you may be given a medicine to increase blood flow to parts of your heart.  When blood flow to your heart has peaked, a tracer will again be injected through the IV tube.  After 20-40 minutes, you will get back on the exam table and have more images taken of your heart.  When the procedure is over, your IV tube will be removed. The procedure may vary among health care providers and hospitals. Depending on the type of tracer used, scans may need to be repeated 3-4  hours later. What happens after the procedure?  Unless your health care provider tells you otherwise, you may return to your normal schedule, including diet, activities, and medicines.  Unless your health care provider tells you otherwise, you may increase your fluid intake. This will help flush the contrast dye from your body. Drink enough fluid to keep your urine clear or pale yellow.  It is up to you to get your test results. Ask your health care provider, or the department that is doing the test, when your results will be ready. Summary  A cardiac nuclear scan measures the blood flow to the heart when a person is resting and when he or she is exercising.  You may need this test if you are at risk for heart disease.  Tell your health care provider if you are pregnant.  Unless your health care provider tells you otherwise, increase your fluid intake. This will help flush the contrast dye from your body. Drink enough fluid to keep your urine clear or pale yellow. This information is not intended to replace advice given to you by your health care provider. Make sure you discuss any questions  you have with your health care provider. Document Released: 11/20/2004 Document Revised: 10/28/2016 Document Reviewed: 10/04/2013 Elsevier Interactive Patient Education  2017 [employer]    Studies Ordered:   Orders Placed This Encounter  Procedures  . MYOCARDIAL PERFUSION IMAGING      Bryan Lemmaavid Harding, M.D., M.S. Interventional Cardiologist   Pager # [phone] Phone # [phone] [address] [place], [place] [address]   Thank you for choosing [facility]!!

## 2017-10-28 NOTE — Patient Instructions (Signed)
No MEDICATION CHANGES      Your physician wants you to follow-up  AFTER TEST IS COMPLETED    TESTING [address] Your physician has requested that you have a lexiscan myoview. For further information please visit [email or web address] Please follow instruction sheet, as given.  Cardiac Nuclear Scan A cardiac nuclear scan is a test that measures blood flow to the heart when a person is resting and when he or she is exercising. The test looks for problems such as:  Not enough blood reaching a portion of the heart.  The heart muscle not working normally.  You may need this test if:  You have heart disease.  You have had abnormal lab results.  You have had heart surgery or angioplasty.  You have chest pain.  You have shortness of breath.  In this test, a radioactive dye (tracer) is injected into your bloodstream. After the tracer has traveled to your heart, an imaging device is used to measure how much of the tracer is absorbed by or distributed to various areas of your heart. This procedure is usually done at a hospital and takes 2-4 hours. Tell a health care provider about:  Any allergies you have.  All medicines you are taking, including vitamins, herbs, eye drops, creams, and over-the-counter medicines.  Any problems you or family members have had with the use of anesthetic medicines.  Any blood disorders you have.  Any surgeries you have had.  Any medical conditions you have.  Whether you are pregnant or may be pregnant. What are the risks? Generally, this is a safe procedure. However, problems may occur, including:  Serious chest pain and heart attack. This is only a risk if the stress portion of the test is done.  Rapid heartbeat.  Sensation of warmth in your chest. This usually passes quickly.  What happens before the procedure?  Ask your health care provider about changing or stopping your regular medicines. This is especially important  if you are taking diabetes medicines or blood thinners.  Remove your jewelry on the day of the procedure. What happens during the procedure?  An IV tube will be inserted into one of your veins.  Your health care provider will inject a small amount of radioactive tracer through the tube.  You will wait for 20-40 minutes while the tracer travels through your bloodstream.  Your heart activity will be monitored with an electrocardiogram (ECG).  You will lie down on an exam table.  Images of your heart will be taken for about 15-20 minutes.  You may be asked to exercise on a treadmill or stationary bike. While you exercise, your heart's activity will be monitored with an ECG, and your blood pressure will be checked. If you are unable to exercise, you may be given a medicine to increase blood flow to parts of your heart.  When blood flow to your heart has peaked, a tracer will again be injected through the IV tube.  After 20-40 minutes, you will get back on the exam table and have more images taken of your heart.  When the procedure is over, your IV tube will be removed. The procedure may vary among health care providers and hospitals. Depending on the type of tracer used, scans may need to be repeated 3-4 hours later. What happens after the procedure?  Unless your health care provider tells you otherwise, you may return to your normal schedule, including diet, activities, and medicines.  Unless your health care provider  tells you otherwise, you may increase your fluid intake. This will help flush the contrast dye from your body. Drink enough fluid to keep your urine clear or pale yellow.  It is up to you to get your test results. Ask your health care provider, or the department that is doing the test, when your results will be ready. Summary  A cardiac nuclear scan measures the blood flow to the heart when a person is resting and when he or she is exercising.  You may need this test if  you are at risk for heart disease.  Tell your health care provider if you are pregnant.  Unless your health care provider tells you otherwise, increase your fluid intake. This will help flush the contrast dye from your body. Drink enough fluid to keep your urine clear or pale yellow. This information is not intended to replace advice given to you by your health care provider. Make sure you discuss any questions you have with your health care provider. Document Released: 11/20/2004 Document Revised: 10/28/2016 Document Reviewed: 10/04/2013 Elsevier Interactive Patient Education  2017 [employer]

## 2017-10-29 ENCOUNTER — Telehealth (HOSPITAL_COMMUNITY): Payer: Self-pay

## 2017-10-29 ENCOUNTER — Encounter: Payer: Self-pay | Admitting: Cardiology

## 2017-10-29 NOTE — Assessment & Plan Note (Signed)
Well-controlled on ACE inhibitor. 

## 2017-10-29 NOTE — Assessment & Plan Note (Signed)
Statin -lipid levels are managed by PCP.  I do not have his labs. -Target LDL should be less than 70 now.

## 2017-10-29 NOTE — Assessment & Plan Note (Signed)
This is a limiting feature for him.  This plus his right leg pain as a result of not fully able to assess his cardiac symptoms and therefore I think it is prudent to proceed with stress test. Lexiscan Myoview.  He is not actively wheezing.

## 2017-10-29 NOTE — Assessment & Plan Note (Signed)
He has cut back of late, but is still dealing with 65-year history.  I would not be surprised if he has COPD and clearly has this is a risk factor for CAD as well as PAD.

## 2017-10-29 NOTE — Telephone Encounter (Signed)
Encounter complete. 

## 2017-10-29 NOTE — Assessment & Plan Note (Signed)
George Salazar has a history of hypertension and hyperlipidemia long-term smoking, likely COPD based on his clinical history.  He has PAD as witnessed by his iliac aneurysm. Apparently he has had a stroke as well.  He does not have diabetes and has not been diagnosed as yet with any.  He denies any heart failure symptoms.  PREOPERATIVE CARDIAC RISK ASSESSMENT   Revised Cardiac Risk Index:  High Risk Surgery: no; could be higher risk if he goes from endovascular to open  Defined as Intraperitoneal, intrathoracic or suprainguinal vascular  Active CAD: no; as yet undiagnosed, but denies any anginal symptoms.  His activity is limited by dyspnea and more so by arthritis and claudication.  CHF: no; no active CHF symptoms  Cerebrovascular Disease: yes; reported history of stroke although I do not see details  Diabetes: no; On Insulin: no  CKD (Cr >~ 2): no; creatinine 1.1  Total: If endovascular, only one, if open, 2. Estimated Risk of Adverse Outcome: Without further information would be low to intermediate Estimated Risk of MI, PE, VF/VT (Cardiac Arrest), Complete Heart Block: 1-3 %   ACC/AHA Guidelines for "Clearance":  Step 1 - Need for Emergency Surgery: No: But relatively urgent  If Yes - go straight to OR with perioperative surveillance  Step 2 - Active Cardiac Conditions (Unstable Angina, Decompensated HF, Significant  Arrhytmias - Complete HB, Mobitz II, Symptomatic VT or SVT, Severe Aortic Stenosis - mean gradient > 40 mmHg, Valve area < 1.0 cm2):   No: He denies any significant angina or heart failure symptoms, however he is so deconditioned and unable to walk because of arthritis pains I cannot fully assess  If Yes - Evaluate & Treat per ACC/AHA Guidelines  Step 3 -  Low Risk Surgery: No: Only if open  If Yes --> proceed to OR  If No --> Step 4  Step 4 - Functional Capacity >= 4 METS without symptoms: No: Unable to walk more than 75 feet at most.  This is not necessarily  related to cardiac issues.  If Yes --> proceed to OR  If No --> Step 5  Step 5 --  Clinical Risk Factors (CRF)   1-2 or more CRFs: Yes --since we cannot fully determine if he does actively have cardiac symptoms based on his lack of activity, it is not unreasonable to proceed with a stress test.  I would prefer not to delay surgery, however endovascular procedure could be done on Plavix if necessary.   At this point, I think is probably reasonable to evaluate for cardiac ischemia with a nuclear stress test.  He would not be able to walk on a treadmill, therefore we will do a Lexiscan I would only react to a intermediate or high risk Myoview result.  If it is low risk, would recommend proceeding with surgery.  He is on statin, not beta-blocker.  Depending on what we find on the stress test would potentially recommend adding beta-blocker

## 2017-11-03 ENCOUNTER — Ambulatory Visit (HOSPITAL_COMMUNITY)
Admission: RE | Admit: 2017-11-03 | Discharge: 2017-11-03 | Disposition: A | Discharge status: Home / Self Care | Payer: Medicare HMO | Source: Ambulatory Visit | Attending: Cardiovascular Disease | Admitting: Cardiovascular Disease

## 2017-11-03 DIAGNOSIS — F17210 Nicotine dependence, cigarettes, uncomplicated: Secondary | ICD-10-CM | POA: Diagnosis not present

## 2017-11-03 DIAGNOSIS — I440 Atrioventricular block, first degree: Secondary | ICD-10-CM | POA: Diagnosis not present

## 2017-11-03 DIAGNOSIS — R001 Bradycardia, unspecified: Secondary | ICD-10-CM | POA: Diagnosis not present

## 2017-11-03 DIAGNOSIS — I724 Aneurysm of artery of lower extremity: Secondary | ICD-10-CM | POA: Diagnosis not present

## 2017-11-03 DIAGNOSIS — Z8673 Personal history of transient ischemic attack (TIA), and cerebral infarction without residual deficits: Secondary | ICD-10-CM | POA: Diagnosis not present

## 2017-11-03 DIAGNOSIS — R0609 Other forms of dyspnea: Secondary | ICD-10-CM | POA: Diagnosis not present

## 2017-11-03 DIAGNOSIS — I10 Essential (primary) hypertension: Secondary | ICD-10-CM | POA: Insufficient documentation

## 2017-11-03 DIAGNOSIS — Z01810 Encounter for preprocedural cardiovascular examination: Secondary | ICD-10-CM | POA: Insufficient documentation

## 2017-11-03 LAB — MYOCARDIAL PERFUSION IMAGING
CHL CUP NUCLEAR SDS: 0
CHL CUP NUCLEAR SRS: 0
CHL CUP NUCLEAR SSS: 0
CSEPPHR: 70 {beats}/min
LV dias vol: 120 mL (ref 62–150)
LV sys vol: 54 mL
Rest HR: 47 {beats}/min
TID: 1.32

## 2017-11-03 MED ORDER — TECHNETIUM TC 99M TETROFOSMIN IV KIT
10.7000 | PACK | Freq: Once | INTRAVENOUS | Status: AC | PRN
  Administered 2017-11-03: 10.7 via INTRAVENOUS
  Filled 2017-11-03: qty 11

## 2017-11-03 MED ORDER — REGADENOSON 0.4 MG/5ML IV SOLN
0.4000 mg | Freq: Once | INTRAVENOUS | Status: AC
  Administered 2017-11-03: 0.4 mg via INTRAVENOUS

## 2017-11-03 MED ORDER — TECHNETIUM TC 99M TETROFOSMIN IV KIT
31.8000 | PACK | Freq: Once | INTRAVENOUS | Status: AC | PRN
  Administered 2017-11-03: 31.8 via INTRAVENOUS
  Filled 2017-11-03: qty 32

## 2017-11-03 NOTE — Telephone Encounter (Signed)
11/03/2017 Received faxed medical records from Dr. Donnel SaxonImran Haque for upcoming appointment with Dr. Herbie BaltimoreHarding on 11/15/2017

## 2017-11-04 ENCOUNTER — Encounter: Payer: Self-pay | Admitting: *Deleted

## 2017-11-08 ENCOUNTER — Other Ambulatory Visit: Payer: Self-pay | Admitting: *Deleted

## 2017-11-15 ENCOUNTER — Other Ambulatory Visit: Payer: Self-pay

## 2017-11-15 ENCOUNTER — Ambulatory Visit: Payer: Medicare HMO | Admitting: Cardiology

## 2017-11-15 ENCOUNTER — Telehealth: Payer: Self-pay | Admitting: Cardiology

## 2017-11-15 ENCOUNTER — Encounter (HOSPITAL_COMMUNITY)
Admission: RE | Admit: 2017-11-15 | Discharge: 2017-11-15 | Disposition: A | Discharge status: Home / Self Care | Payer: Medicare HMO | Source: Ambulatory Visit | Attending: Vascular Surgery | Admitting: Vascular Surgery

## 2017-11-15 ENCOUNTER — Encounter (HOSPITAL_COMMUNITY): Payer: Self-pay

## 2017-11-15 LAB — URINALYSIS, ROUTINE W REFLEX MICROSCOPIC
Bilirubin Urine: NEGATIVE
GLUCOSE, UA: NEGATIVE mg/dL
HGB URINE DIPSTICK: NEGATIVE
KETONES UR: NEGATIVE mg/dL
NITRITE: NEGATIVE
Protein, ur: NEGATIVE mg/dL
Specific Gravity, Urine: 1.018 (ref 1.005–1.030)
pH: 5 (ref 5.0–8.0)

## 2017-11-15 LAB — CBC
HEMATOCRIT: 36 % — AB (ref 39.0–52.0)
Hemoglobin: 11.8 g/dL — ABNORMAL LOW (ref 13.0–17.0)
MCH: 31.4 pg (ref 26.0–34.0)
MCHC: 32.8 g/dL (ref 30.0–36.0)
MCV: 95.7 fL (ref 78.0–100.0)
PLATELETS: 196 10*3/uL (ref 150–400)
RBC: 3.76 MIL/uL — ABNORMAL LOW (ref 4.22–5.81)
RDW: 12.1 % (ref 11.5–15.5)
WBC: 9.6 10*3/uL (ref 4.0–10.5)

## 2017-11-15 LAB — TYPE AND SCREEN
ABO/RH(D): O POS
ANTIBODY SCREEN: NEGATIVE

## 2017-11-15 LAB — COMPREHENSIVE METABOLIC PANEL
ALT: 14 U/L — ABNORMAL LOW (ref 17–63)
ANION GAP: 4 — AB (ref 5–15)
AST: 26 U/L (ref 15–41)
Albumin: 4.2 g/dL (ref 3.5–5.0)
Alkaline Phosphatase: 72 U/L (ref 38–126)
BILIRUBIN TOTAL: 0.7 mg/dL (ref 0.3–1.2)
BUN: 19 mg/dL (ref 6–20)
CHLORIDE: 109 mmol/L (ref 101–111)
CO2: 21 mmol/L — ABNORMAL LOW (ref 22–32)
Calcium: 9.4 mg/dL (ref 8.9–10.3)
Creatinine, Ser: 1.03 mg/dL (ref 0.61–1.24)
Glucose, Bld: 112 mg/dL — ABNORMAL HIGH (ref 65–99)
POTASSIUM: 4.8 mmol/L (ref 3.5–5.1)
Sodium: 134 mmol/L — ABNORMAL LOW (ref 135–145)
TOTAL PROTEIN: 7.1 g/dL (ref 6.5–8.1)

## 2017-11-15 LAB — BLOOD GAS, ARTERIAL
ACID-BASE DEFICIT: 2 mmol/L (ref 0.0–2.0)
BICARBONATE: 22.2 mmol/L (ref 20.0–28.0)
Drawn by: 421801
FIO2: 21
O2 SAT: 93.6 %
PCO2 ART: 36.8 mmHg (ref 32.0–48.0)
PO2 ART: 70.9 mmHg — AB (ref 83.0–108.0)
Patient temperature: 98.6
pH, Arterial: 7.397 (ref 7.350–7.450)

## 2017-11-15 LAB — PROTIME-INR
INR: 1.08
PROTHROMBIN TIME: 13.9 s (ref 11.4–15.2)

## 2017-11-15 LAB — APTT: aPTT: 26 seconds (ref 24–36)

## 2017-11-15 LAB — ABO/RH: ABO/RH(D): O POS

## 2017-11-15 LAB — SURGICAL PCR SCREEN
MRSA, PCR: NEGATIVE
Staphylococcus aureus: POSITIVE — AB

## 2017-11-15 NOTE — Pre-Procedure Instructions (Addendum)
[facility]  11/15/2017      [facility] - [place], [place] - [address] [place] [id number] Phone: [phone] Fax: [phone]    Your procedure is scheduled on  Thursday 11/18/17  [facility] Admitting at 530 A.M.  Call this number if you have problems the morning of surgery:  [phone]   Remember:  Do not eat food or drink liquids after midnight.  Take these medicines the morning of surgery with A SIP OF WATER- CITALOPRAM (CELEXA), FLUOXETINE (PROZAC), HYDROCODONE IF NEEDED  7 days prior to surgery STOP taking any (Aleve, Naproxen, Ibuprofen, Motrin, Advil, Goody's, BC's, all herbal medications, fish oil, and all vitamins   Do not wear jewelry, make-up or nail polish.  Do not wear lotions, powders, or perfumes, or deodorant.  Do not shave 48 hours prior to surgery.  Men may shave face and neck.  Do not bring valuables to the hospital.  [facility] is not responsible for any belongings or valuables.  Contacts, dentures or bridgework may not be worn into surgery.  Leave your suitcase in the car.  After surgery it may be brought to your room.  For patients admitted to the hospital, discharge time will be determined by your treatment team.  Patients discharged the day of surgery will not be allowed to drive home.   Name and phone number of your driver:    Special instructions:  [facility] - Preparing for Surgery  Before surgery, you can play an important role.  Because skin is not sterile, your skin needs to be as free of germs as possible.  You can reduce the number of germs on you skin by washing with CHG (chlorahexidine gluconate) soap before surgery.  CHG is an antiseptic cleaner which kills germs and bonds with the skin to continue killing germs even after washing.  Please DO NOT use if you have an allergy to CHG or antibacterial soaps.  If your skin becomes reddened/irritated stop using the CHG  and inform your nurse when you arrive at Short Stay.  Do not shave (including legs and underarms) for at least 48 hours prior to the first CHG shower.  You may shave your face.  Please follow these instructions carefully:   1.  Shower with CHG Soap the night before surgery and the                                morning of Surgery.  2.  If you choose to wash your hair, wash your hair first as usual with your       normal shampoo.  3.  After you shampoo, rinse your hair and body thoroughly to remove the                      Shampoo.  4.  Use CHG as you would any other liquid soap.  You can apply chg directly       to the skin and wash gently with scrungie or a clean washcloth.  5.  Apply the CHG Soap to your body ONLY FROM THE NECK DOWN.        Do not use on open wounds or open sores.  Avoid contact with your eyes,       ears, mouth and genitals (private parts).  Wash genitals (private parts)  with your normal soap.  6.  Wash thoroughly, paying special attention to the area where your surgery        will be performed.  7.  Thoroughly rinse your body with warm water from the neck down.  8.  DO NOT shower/wash with your normal soap after using and rinsing off       the CHG Soap.  9.  Pat yourself dry with a clean towel.            10.  Wear clean pajamas.            11.  Place clean sheets on your bed the night of your first shower and do not        sleep with pets.  Day of Surgery  Do not apply any lotions/deoderants the morning of surgery.  Please wear clean clothes to the hospital/surgery center.    Please read over the following fact sheets that you were given. MRSA Information and Surgical Site Infection Prevention

## 2017-11-15 NOTE — Progress Notes (Signed)
SPOKE WITH KAY AT DR. DICKSON'S OFFICE WHO STATED PATIENT SHOULD CONTINUE ASPIRIN.

## 2017-11-16 NOTE — Progress Notes (Signed)
Anesthesia Chart Review:  Pt is a 72 year old male scheduled for abdominal aortic endovascular stent graft on 11/18/2017 with Waverly Ferrarihristopher Dickson, MD  - PCP is Donnel SaxonImran Haque, MD - Saw cardiologist Bryan Lemmaavid Harding, MD for pre-op eval 10/28/17.  Stress test ordered, results below. Dr. Herbie BaltimoreHarding recommends proceeding with surgery with low risk stress test.   PMH includes:  HTN, hyperlipidemia, stroke, right CIA aneurysm. Current smoker. BMI 30. S/p coil embolization of R hypogastric artery  Medications include: ASA 81 mg, ramipril, simvastatin  BP (!) 113/57   Pulse 63   Temp 36.5 C   Resp 20   Ht 6\' 1"  (1.854 m)   Wt 225 lb (102.1 kg)   SpO2 99%   BMI 29.69 kg/m   Preoperative labs reviewed.    EKG 10/18/17: Sinus bradycardia (53 bpm). Low voltage QRS  Nuclear stress test 11/03/17:   The left ventricular ejection fraction is normal (55-65%).  Nuclear stress EF: 55%.  There was no ST segment deviation noted during stress.  The study is normal.  This is a low risk study. - Normal stress nuclear study with no ischemia or infarction; EF 55; normal wall motion.  AAA duplex 09/29/17:  1. Evidence R CIA aneurysm, maximum diameter 4.4cm with intramural thrombus present.  2. Evidence of abdominal aortic aneurysm with max diameter 2.9 x 3.1cm  If no changes, I anticipate pt can proceed with surgery as scheduled.   Rica Mastngela Shabree Tebbetts, FNP-BC [facility]/Anesthesiology Phone: [phone] 11/16/2017 11:57 AM

## 2017-11-18 ENCOUNTER — Encounter (HOSPITAL_COMMUNITY): Payer: Self-pay

## 2017-11-18 ENCOUNTER — Encounter (HOSPITAL_COMMUNITY)
Admission: RE | Disposition: A | Discharge status: Home / Self Care | Payer: Self-pay | Source: Ambulatory Visit | Attending: Vascular Surgery

## 2017-11-18 ENCOUNTER — Inpatient Hospital Stay (HOSPITAL_COMMUNITY): Payer: Medicare HMO | Admitting: Certified Registered"

## 2017-11-18 ENCOUNTER — Inpatient Hospital Stay (HOSPITAL_COMMUNITY): Payer: Medicare HMO | Admitting: Emergency Medicine

## 2017-11-18 ENCOUNTER — Other Ambulatory Visit: Payer: Self-pay

## 2017-11-18 ENCOUNTER — Inpatient Hospital Stay (HOSPITAL_COMMUNITY)
Admission: RE | Admit: 2017-11-18 | Discharge: 2017-11-19 | DRG: 269 | Disposition: A | Discharge status: Home / Self Care | Payer: Medicare HMO | Source: Ambulatory Visit | Attending: Vascular Surgery | Admitting: Vascular Surgery

## 2017-11-18 DIAGNOSIS — I10 Essential (primary) hypertension: Secondary | ICD-10-CM | POA: Diagnosis present

## 2017-11-18 DIAGNOSIS — G8929 Other chronic pain: Secondary | ICD-10-CM | POA: Diagnosis present

## 2017-11-18 DIAGNOSIS — I723 Aneurysm of iliac artery: Secondary | ICD-10-CM | POA: Diagnosis present

## 2017-11-18 DIAGNOSIS — M1990 Unspecified osteoarthritis, unspecified site: Secondary | ICD-10-CM | POA: Diagnosis present

## 2017-11-18 DIAGNOSIS — I714 Abdominal aortic aneurysm, without rupture, unspecified: Secondary | ICD-10-CM | POA: Diagnosis present

## 2017-11-18 DIAGNOSIS — F17210 Nicotine dependence, cigarettes, uncomplicated: Secondary | ICD-10-CM | POA: Diagnosis present

## 2017-11-18 DIAGNOSIS — M545 Low back pain: Secondary | ICD-10-CM | POA: Diagnosis present

## 2017-11-18 DIAGNOSIS — E785 Hyperlipidemia, unspecified: Secondary | ICD-10-CM | POA: Diagnosis present

## 2017-11-18 DIAGNOSIS — Z8673 Personal history of transient ischemic attack (TIA), and cerebral infarction without residual deficits: Secondary | ICD-10-CM | POA: Diagnosis not present

## 2017-11-18 DIAGNOSIS — Z79891 Long term (current) use of opiate analgesic: Secondary | ICD-10-CM

## 2017-11-18 HISTORY — PX: ABDOMINAL AORTIC ENDOVASCULAR STENT GRAFT: SHX5707

## 2017-11-18 LAB — BASIC METABOLIC PANEL
ANION GAP: 7 (ref 5–15)
BUN: 18 mg/dL (ref 6–20)
CHLORIDE: 105 mmol/L (ref 101–111)
CO2: 23 mmol/L (ref 22–32)
Calcium: 8.6 mg/dL — ABNORMAL LOW (ref 8.9–10.3)
Creatinine, Ser: 1.02 mg/dL (ref 0.61–1.24)
GFR calc Af Amer: >60 mL/min (ref >60)
GFR calc non Af Amer: >60 mL/min (ref >60)
GLUCOSE: 105 mg/dL — AB (ref 65–99)
POTASSIUM: 4 mmol/L (ref 3.5–5.1)
Sodium: 135 mmol/L (ref 135–145)

## 2017-11-18 LAB — CBC
HEMATOCRIT: 32.8 % — AB (ref 39.0–52.0)
HEMOGLOBIN: 10.5 g/dL — AB (ref 13.0–17.0)
MCH: 30.8 pg (ref 26.0–34.0)
MCHC: 32 g/dL (ref 30.0–36.0)
MCV: 96.2 fL (ref 78.0–100.0)
Platelets: 180 10*3/uL (ref 150–400)
RBC: 3.41 MIL/uL — AB (ref 4.22–5.81)
RDW: 12.4 % (ref 11.5–15.5)
WBC: 8.4 10*3/uL (ref 4.0–10.5)

## 2017-11-18 LAB — PROTIME-INR
INR: 1.19
Prothrombin Time: 15 seconds (ref 11.4–15.2)

## 2017-11-18 LAB — APTT: APTT: 35 s (ref 24–36)

## 2017-11-18 LAB — MAGNESIUM: MAGNESIUM: 2 mg/dL (ref 1.7–2.4)

## 2017-11-18 SURGERY — INSERTION, ENDOVASCULAR STENT GRAFT, AORTA, ABDOMINAL
Anesthesia: General

## 2017-11-18 MED ORDER — OXYCODONE HCL 5 MG/5ML PO SOLN: 5.0000 mg | Freq: Once | ORAL | Status: DC | PRN

## 2017-11-18 MED ORDER — DOCUSATE SODIUM 100 MG PO CAPS
100.0000 mg | ORAL_CAPSULE | Freq: Every day | ORAL | Status: DC
  Filled 2017-11-18: qty 1

## 2017-11-18 MED ORDER — LIDOCAINE 2% (20 MG/ML) 5 ML SYRINGE
INTRAMUSCULAR | Status: AC
  Filled 2017-11-18: qty 5

## 2017-11-18 MED ORDER — POTASSIUM CHLORIDE CRYS ER 20 MEQ PO TBCR: 20.0000 meq | EXTENDED_RELEASE_TABLET | Freq: Every day | ORAL | Status: DC | PRN

## 2017-11-18 MED ORDER — SUGAMMADEX SODIUM 200 MG/2ML IV SOLN
INTRAVENOUS | Status: DC | PRN
  Administered 2017-11-18: 200 mg via INTRAVENOUS

## 2017-11-18 MED ORDER — HYDROCODONE-ACETAMINOPHEN 10-325 MG PO TABS: 1.0000 | ORAL_TABLET | Freq: Three times a day (TID) | ORAL | Status: DC | PRN

## 2017-11-18 MED ORDER — FENTANYL CITRATE (PF) 250 MCG/5ML IJ SOLN
INTRAMUSCULAR | Status: DC | PRN
  Administered 2017-11-18 (×3): 50 ug via INTRAVENOUS

## 2017-11-18 MED ORDER — GUAIFENESIN-DM 100-10 MG/5ML PO SYRP: 15.0000 mL | ORAL_SOLUTION | ORAL | Status: DC | PRN

## 2017-11-18 MED ORDER — OXYCODONE HCL 5 MG PO TABS: 5.0000 mg | ORAL_TABLET | Freq: Once | ORAL | Status: DC | PRN

## 2017-11-18 MED ORDER — ADULT MULTIVITAMIN W/MINERALS CH
1.0000 | ORAL_TABLET | Freq: Every day | ORAL | Status: DC
  Filled 2017-11-18: qty 1

## 2017-11-18 MED ORDER — DEXAMETHASONE SODIUM PHOSPHATE 10 MG/ML IJ SOLN
INTRAMUSCULAR | Status: AC
  Filled 2017-11-18: qty 1

## 2017-11-18 MED ORDER — OXYCODONE-ACETAMINOPHEN 5-325 MG PO TABS
1.0000 | ORAL_TABLET | ORAL | Status: DC | PRN
  Administered 2017-11-19: 1 via ORAL
  Filled 2017-11-18: qty 1

## 2017-11-18 MED ORDER — SODIUM CHLORIDE 0.9 % IV SOLN: 500.0000 mL | Freq: Once | INTRAVENOUS | Status: DC | PRN

## 2017-11-18 MED ORDER — CHLORHEXIDINE GLUCONATE CLOTH 2 % EX PADS: 6.0000 | MEDICATED_PAD | Freq: Once | CUTANEOUS | Status: DC

## 2017-11-18 MED ORDER — ONDANSETRON HCL 4 MG/2ML IJ SOLN
INTRAMUSCULAR | Status: AC
  Filled 2017-11-18: qty 2

## 2017-11-18 MED ORDER — ACETAMINOPHEN 650 MG RE SUPP: 325.0000 mg | RECTAL | Status: DC | PRN

## 2017-11-18 MED ORDER — MIDAZOLAM HCL 2 MG/2ML IJ SOLN
INTRAMUSCULAR | Status: AC
  Filled 2017-11-18: qty 2

## 2017-11-18 MED ORDER — PROTAMINE SULFATE 10 MG/ML IV SOLN
INTRAVENOUS | Status: DC | PRN
  Administered 2017-11-18: 50 mg via INTRAVENOUS

## 2017-11-18 MED ORDER — HYDRALAZINE HCL 20 MG/ML IJ SOLN: 5.0000 mg | INTRAMUSCULAR | Status: DC | PRN

## 2017-11-18 MED ORDER — ONDANSETRON HCL 4 MG/2ML IJ SOLN: 4.0000 mg | Freq: Once | INTRAMUSCULAR | Status: DC | PRN

## 2017-11-18 MED ORDER — PROTAMINE SULFATE 10 MG/ML IV SOLN
INTRAVENOUS | Status: AC
  Filled 2017-11-18: qty 5

## 2017-11-18 MED ORDER — SODIUM CHLORIDE 0.9 % IV SOLN
INTRAVENOUS | Status: DC
  Administered 2017-11-18: 07:00:00 via INTRAVENOUS

## 2017-11-18 MED ORDER — DIPHENHYDRAMINE HCL 25 MG PO CAPS: 50.0000 mg | ORAL_CAPSULE | Freq: Every evening | ORAL | Status: DC | PRN

## 2017-11-18 MED ORDER — SIMVASTATIN 40 MG PO TABS
40.0000 mg | ORAL_TABLET | Freq: Every day | ORAL | Status: DC
  Filled 2017-11-18 (×2): qty 1

## 2017-11-18 MED ORDER — PROPOFOL 10 MG/ML IV BOLUS
INTRAVENOUS | Status: AC
  Filled 2017-11-18: qty 20

## 2017-11-18 MED ORDER — DEXTROSE 5 % IV SOLN
INTRAVENOUS | Status: DC | PRN
  Administered 2017-11-18: 20 ug/min via INTRAVENOUS

## 2017-11-18 MED ORDER — ROCURONIUM BROMIDE 10 MG/ML (PF) SYRINGE
PREFILLED_SYRINGE | INTRAVENOUS | Status: AC
  Filled 2017-11-18: qty 5

## 2017-11-18 MED ORDER — MORPHINE SULFATE (PF) 2 MG/ML IV SOLN: 2.0000 mg | INTRAVENOUS | Status: DC | PRN

## 2017-11-18 MED ORDER — FENTANYL CITRATE (PF) 250 MCG/5ML IJ SOLN
INTRAMUSCULAR | Status: AC
  Filled 2017-11-18: qty 5

## 2017-11-18 MED ORDER — CITALOPRAM HYDROBROMIDE 20 MG PO TABS
20.0000 mg | ORAL_TABLET | Freq: Every day | ORAL | Status: DC
  Administered 2017-11-18: 20 mg via ORAL
  Filled 2017-11-18 (×2): qty 1

## 2017-11-18 MED ORDER — ONDANSETRON HCL 4 MG/2ML IJ SOLN
INTRAMUSCULAR | Status: DC | PRN
  Administered 2017-11-18: 4 mg via INTRAVENOUS

## 2017-11-18 MED ORDER — ACETAMINOPHEN 325 MG PO TABS: 325.0000 mg | ORAL_TABLET | ORAL | Status: DC | PRN

## 2017-11-18 MED ORDER — ASPIRIN EC 81 MG PO TBEC
81.0000 mg | DELAYED_RELEASE_TABLET | Freq: Every day | ORAL | Status: DC
  Administered 2017-11-18: 81 mg via ORAL
  Filled 2017-11-18 (×2): qty 1

## 2017-11-18 MED ORDER — MAGNESIUM SULFATE 2 GM/50ML IV SOLN
2.0000 g | Freq: Every day | INTRAVENOUS | Status: DC | PRN
  Filled 2017-11-18: qty 50

## 2017-11-18 MED ORDER — GLYCOPYRROLATE 0.2 MG/ML IJ SOLN
INTRAMUSCULAR | Status: DC | PRN
  Administered 2017-11-18: 0.2 mg via INTRAVENOUS

## 2017-11-18 MED ORDER — LACTATED RINGERS IV SOLN
INTRAVENOUS | Status: DC | PRN
  Administered 2017-11-18: 07:00:00 via INTRAVENOUS

## 2017-11-18 MED ORDER — DEXTROSE 5 % IV SOLN
1.5000 g | INTRAVENOUS | Status: AC
  Administered 2017-11-18: 1.5 g via INTRAVENOUS

## 2017-11-18 MED ORDER — DEXAMETHASONE SODIUM PHOSPHATE 10 MG/ML IJ SOLN
INTRAMUSCULAR | Status: DC | PRN
  Administered 2017-11-18: 5 mg via INTRAVENOUS

## 2017-11-18 MED ORDER — FENTANYL CITRATE (PF) 100 MCG/2ML IJ SOLN
25.0000 ug | INTRAMUSCULAR | Status: DC | PRN
  Administered 2017-11-18 (×2): 25 ug via INTRAVENOUS

## 2017-11-18 MED ORDER — LIDOCAINE 2% (20 MG/ML) 5 ML SYRINGE
INTRAMUSCULAR | Status: DC | PRN
  Administered 2017-11-18: 60 mg via INTRAVENOUS

## 2017-11-18 MED ORDER — SUGAMMADEX SODIUM 200 MG/2ML IV SOLN
INTRAVENOUS | Status: AC
  Filled 2017-11-18: qty 2

## 2017-11-18 MED ORDER — LABETALOL HCL 5 MG/ML IV SOLN: 10.0000 mg | INTRAVENOUS | Status: DC | PRN

## 2017-11-18 MED ORDER — VITAMIN D (ERGOCALCIFEROL) 1.25 MG (50000 UNIT) PO CAPS
50000.0000 [IU] | ORAL_CAPSULE | ORAL | Status: DC
  Filled 2017-11-18: qty 1

## 2017-11-18 MED ORDER — FENTANYL CITRATE (PF) 100 MCG/2ML IJ SOLN
INTRAMUSCULAR | Status: AC
  Filled 2017-11-18: qty 2

## 2017-11-18 MED ORDER — HEPARIN SODIUM (PORCINE) 5000 UNIT/ML IJ SOLN
INTRAMUSCULAR | Status: DC | PRN
  Administered 2017-11-18: 09:00:00

## 2017-11-18 MED ORDER — PHENOL 1.4 % MT LIQD: 1.0000 | OROMUCOSAL | Status: DC | PRN

## 2017-11-18 MED ORDER — PROPOFOL 10 MG/ML IV BOLUS
INTRAVENOUS | Status: DC | PRN
  Administered 2017-11-18 (×2): 50 mg via INTRAVENOUS
  Administered 2017-11-18: 150 mg via INTRAVENOUS

## 2017-11-18 MED ORDER — IODIXANOL 320 MG/ML IV SOLN
INTRAVENOUS | Status: DC | PRN
  Administered 2017-11-18: 50.4 mL via INTRAVENOUS

## 2017-11-18 MED ORDER — FLUOXETINE HCL 20 MG PO CAPS
20.0000 mg | ORAL_CAPSULE | Freq: Every day | ORAL | Status: DC
  Administered 2017-11-18: 20 mg via ORAL
  Filled 2017-11-18 (×2): qty 1

## 2017-11-18 MED ORDER — EPHEDRINE SULFATE-NACL 50-0.9 MG/10ML-% IV SOSY
PREFILLED_SYRINGE | INTRAVENOUS | Status: DC | PRN
  Administered 2017-11-18 (×2): 5 mg via INTRAVENOUS

## 2017-11-18 MED ORDER — METOPROLOL TARTRATE 5 MG/5ML IV SOLN: 2.0000 mg | INTRAVENOUS | Status: DC | PRN

## 2017-11-18 MED ORDER — RAMIPRIL 2.5 MG PO CAPS
5.0000 mg | ORAL_CAPSULE | Freq: Every day | ORAL | Status: DC
  Administered 2017-11-18: 5 mg via ORAL
  Filled 2017-11-18 (×2): qty 2

## 2017-11-18 MED ORDER — DEXTROSE 5 % IV SOLN
1.5000 g | Freq: Two times a day (BID) | INTRAVENOUS | Status: AC
  Administered 2017-11-18 – 2017-11-19 (×2): 1.5 g via INTRAVENOUS
  Filled 2017-11-18 (×2): qty 1.5

## 2017-11-18 MED ORDER — ALUM & MAG HYDROXIDE-SIMETH 200-200-20 MG/5ML PO SUSP: 15.0000 mL | ORAL | Status: DC | PRN

## 2017-11-18 MED ORDER — ROCURONIUM BROMIDE 10 MG/ML (PF) SYRINGE
PREFILLED_SYRINGE | INTRAVENOUS | Status: DC | PRN
  Administered 2017-11-18: 10 mg via INTRAVENOUS
  Administered 2017-11-18: 50 mg via INTRAVENOUS

## 2017-11-18 MED ORDER — HEPARIN SODIUM (PORCINE) 1000 UNIT/ML IJ SOLN
INTRAMUSCULAR | Status: DC | PRN
  Administered 2017-11-18: 10000 [IU] via INTRAVENOUS

## 2017-11-18 MED ORDER — DIPHENHYDRAMINE-APAP (SLEEP) 25-500 MG PO TABS: 2.0000 | ORAL_TABLET | Freq: Every day | ORAL | Status: DC

## 2017-11-18 MED ORDER — PANTOPRAZOLE SODIUM 40 MG PO TBEC
40.0000 mg | DELAYED_RELEASE_TABLET | Freq: Every day | ORAL | Status: DC
  Filled 2017-11-18 (×2): qty 1

## 2017-11-18 MED ORDER — DEXTROSE 5 % IV SOLN
INTRAVENOUS | Status: AC
  Filled 2017-11-18: qty 1.5

## 2017-11-18 MED ORDER — ONDANSETRON HCL 4 MG/2ML IJ SOLN: 4.0000 mg | Freq: Four times a day (QID) | INTRAMUSCULAR | Status: DC | PRN

## 2017-11-18 MED ORDER — ENOXAPARIN SODIUM 40 MG/0.4ML ~~LOC~~ SOLN: 40.0000 mg | SUBCUTANEOUS | Status: DC

## 2017-11-18 MED ORDER — 0.9 % SODIUM CHLORIDE (POUR BTL) OPTIME
TOPICAL | Status: DC | PRN
  Administered 2017-11-18: 1000 mL

## 2017-11-18 SURGICAL SUPPLY — 64 items
BLADE CLIPPER SURG (BLADE) ×3 IMPLANT
CANISTER SUCT 3000ML PPV (MISCELLANEOUS) ×3 IMPLANT
CATH ACCU-VU SIZ PIG 5F 70CM (CATHETERS) IMPLANT
CATH ANGIO 5F BER 65CM (CATHETERS) ×3 IMPLANT
CATH ANGIO 5F BER2 65CM (CATHETERS) IMPLANT
CATH BEACON 5.038 65CM KMP-01 (CATHETERS) IMPLANT
CATH OMNI FLUSH .035X70CM (CATHETERS) ×3 IMPLANT
COVER PROBE W GEL 5X96 (DRAPES) IMPLANT
DERMABOND ADHESIVE PROPEN (GAUZE/BANDAGES/DRESSINGS) ×4
DERMABOND ADVANCED (GAUZE/BANDAGES/DRESSINGS) ×2
DERMABOND ADVANCED .7 DNX12 (GAUZE/BANDAGES/DRESSINGS) ×1 IMPLANT
DERMABOND ADVANCED .7 DNX6 (GAUZE/BANDAGES/DRESSINGS) ×2 IMPLANT
DEVICE CLOSURE PERCLS PRGLD 6F (VASCULAR PRODUCTS) ×4 IMPLANT
DRESSING OPSITE X SMALL 2X3 (GAUZE/BANDAGES/DRESSINGS) ×6 IMPLANT
DRSG TEGADERM 2-3/8X2-3/4 SM (GAUZE/BANDAGES/DRESSINGS) ×6 IMPLANT
DRYSEAL FLEXSHEATH 12FR 33CM (SHEATH) ×2
DRYSEAL FLEXSHEATH 16FR 33CM (SHEATH) ×2
ELECT CAUTERY BLADE 6.4 (BLADE) ×3 IMPLANT
ELECT REM PT RETURN 9FT ADLT (ELECTROSURGICAL) ×6
ELECTRODE REM PT RTRN 9FT ADLT (ELECTROSURGICAL) ×2 IMPLANT
EXCLUDER TNK LEG 26MX12X18 (Endovascular Graft) ×1 IMPLANT
EXCLUDER TRUNK LEG 26MX12X18 (Endovascular Graft) ×3 IMPLANT
EXTENDER ENDOPROSTHESIS 12X7 (Endovascular Graft) ×3 IMPLANT
GAUZE SPONGE 2X2 8PLY STRL LF (GAUZE/BANDAGES/DRESSINGS) ×2 IMPLANT
GLOVE BIO SURGEON STRL SZ7 (GLOVE) ×3 IMPLANT
GLOVE BIO SURGEON STRL SZ7.5 (GLOVE) ×3 IMPLANT
GLOVE BIOGEL PI IND STRL 6.5 (GLOVE) ×2 IMPLANT
GLOVE BIOGEL PI IND STRL 7.0 (GLOVE) ×1 IMPLANT
GLOVE BIOGEL PI IND STRL 7.5 (GLOVE) ×1 IMPLANT
GLOVE BIOGEL PI IND STRL 8 (GLOVE) ×1 IMPLANT
GLOVE BIOGEL PI INDICATOR 6.5 (GLOVE) ×4
GLOVE BIOGEL PI INDICATOR 7.0 (GLOVE) ×2
GLOVE BIOGEL PI INDICATOR 7.5 (GLOVE) ×2
GLOVE BIOGEL PI INDICATOR 8 (GLOVE) ×2
GOWN STRL NON-REIN LRG LVL3 (GOWN DISPOSABLE) ×3 IMPLANT
GOWN STRL REUS W/ TWL LRG LVL3 (GOWN DISPOSABLE) ×3 IMPLANT
GOWN STRL REUS W/TWL LRG LVL3 (GOWN DISPOSABLE) ×6
GRAFT BALLN CATH 65CM (STENTS) ×1 IMPLANT
KIT BASIN OR (CUSTOM PROCEDURE TRAY) ×3 IMPLANT
KIT ROOM TURNOVER OR (KITS) ×3 IMPLANT
LEG CONTRALATERAL 18X11.5 (Endovascular Graft) ×3 IMPLANT
NEEDLE PERC 18GX7CM (NEEDLE) ×3 IMPLANT
NS IRRIG 1000ML POUR BTL (IV SOLUTION) ×3 IMPLANT
PACK ENDOVASCULAR (PACKS) ×3 IMPLANT
PAD ARMBOARD 7.5X6 YLW CONV (MISCELLANEOUS) ×6 IMPLANT
PENCIL BUTTON HOLSTER BLD 10FT (ELECTRODE) ×3 IMPLANT
PERCLOSE PROGLIDE 6F (VASCULAR PRODUCTS) ×12
SHEATH AVANTI 11CM 8FR (MISCELLANEOUS) IMPLANT
SHEATH BRITE TIP 8FR 23CM (MISCELLANEOUS) ×3 IMPLANT
SHEATH DRYSEAL FLEX 12FR 33CM (SHEATH) ×1 IMPLANT
SHEATH DRYSEAL FLEX 16FR 33CM (SHEATH) ×1 IMPLANT
SHEATH PINNACLE 8F 10CM (SHEATH) ×3 IMPLANT
SPONGE GAUZE 2X2 STER 10/PKG (GAUZE/BANDAGES/DRESSINGS) ×4
STAPLER VISISTAT (STAPLE) IMPLANT
STENT GRAFT BALLN CATH 65CM (STENTS) ×2
STOPCOCK MORSE 400PSI 3WAY (MISCELLANEOUS) ×6 IMPLANT
SUT PROLENE 5 0 C 1 24 (SUTURE) IMPLANT
SUT VICRYL 4-0 PS2 18IN ABS (SUTURE) ×6 IMPLANT
SYR 30ML LL (SYRINGE) ×3 IMPLANT
TOWEL GREEN STERILE (TOWEL DISPOSABLE) ×3 IMPLANT
TRAY FOLEY W/METER SILVER 16FR (SET/KITS/TRAYS/PACK) ×3 IMPLANT
TUBING HIGH PRESSURE 120CM (CONNECTOR) ×6 IMPLANT
WIRE AMPLATZ SS-J .035X180CM (WIRE) ×6 IMPLANT
WIRE BENTSON .035X145CM (WIRE) ×6 IMPLANT

## 2017-11-18 NOTE — Anesthesia Preprocedure Evaluation (Signed)
Anesthesia Evaluation  Patient identified by MRN, date of birth, ID band Patient awake    Reviewed: Allergy & Precautions, NPO status , Patient's Chart, lab work & pertinent test results  Airway Mallampati: II  TM Distance: >3 FB Neck ROM: Full    Dental  (+) Edentulous Upper, Partial Lower, Dental Advisory Given   Pulmonary Current Smoker,    breath sounds clear to auscultation       Cardiovascular hypertension,  Rhythm:Regular Rate:Normal     Neuro/Psych    GI/Hepatic   Endo/Other    Renal/GU      Musculoskeletal   Abdominal   Peds  Hematology   Anesthesia Other Findings Limited speech  Reproductive/Obstetrics                             Anesthesia Physical Anesthesia Plan  ASA: III  Anesthesia Plan: General   Post-op Pain Management:    Induction: Intravenous  PONV Risk Score and Plan: Ondansetron and Dexamethasone  Airway Management Planned: Oral ETT  Additional Equipment:   Intra-op Plan:   Post-operative Plan: Extubation in OR  Informed Consent: I have reviewed the patients History and Physical, chart, labs and discussed the procedure including the risks, benefits and alternatives for the proposed anesthesia with the patient or authorized representative who has indicated his/her understanding and acceptance.     Plan Discussed with: CRNA and Anesthesiologist  Anesthesia Plan Comments:         Anesthesia Quick Evaluation

## 2017-11-18 NOTE — Op Note (Signed)
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 Edilia Bo, MD; Leonides Sake, MD  ANESTHESIA: General  EBL: Minimal  INDICATIONS:    George Salazar is a 72 y.o. 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  He underwent preoperative coil embolization of the right internal iliac artery and presents for endovascular aneurysm repair.  FINDINGS:   Completion arteriogram showed no endoleak and the graft in excellent position.  TECHNIQUE:   The patient was taken to the operating room and received a general anesthetic.  The abdomen and both groins were prepped and draped in the usual sterile fashion.  On the right side, under ultrasound guidance, the right common femoral artery was cannulated and a Bentson wire introduced under fluoroscopic control.  The tract over the wire was dilated with an 8 [place] dilator.  At the initial Perclose device was rotated 15 degrees medially and deployed without difficulty.  The second device was rotated 15 degrees laterally and likewise was deployed without difficulty.  A short 8 French sheath was then placed on the right side.  Likewise, on the left side the left common femoral artery was preclosed using 2 Perclose devices as dictated by Dr. Imogene Burn.  The patient was then heparinized.  On the right side, the Bentson wire was exchanged for an Amplatz wire over a catheter.  The position of the distal Amplatz wire was noted under fluoroscopy and the wire marked to prevent advancement of the wire beyond the arch.  The 8 French sheath was then exchanged for the 16  French sheath without difficulty.  The trunk ipsilateral component was a 26 mm x 12 mm x 18 cm device.  This was advanced over the Amplatz wire into the sheath with the gait crossed.  This was positioned at the level of the renal arteries.  From the left side the pigtail catheter was placed just above the renals and an arteriogram obtained to demonstrate position of the renal arteries.  The proximal graft was then deployed and was slightly high.  Therefore this was constrained and then advanced slightly and then the graft opened again and was excellent position below the renal arteries based on follow-up arteriogram.  Next, the contralateral gate was cannulated as dictated by Dr. Imogene Burn.  We confirmed that we were within the graft by turning the Omni Flush catheter.  The Amplatz wire was then advanced in a retrograde iliac arteriogram obtained on the left in an RAO projection in order to demonstrate the position of the hypogastric artery.  On the left side and 18 mm x 11.5 cm device was selected and positioned with 3 cm of overlap into the trunk ipsilateral component.  The sheath had been retracted.  This was deployed without difficulty.  The next the ipsilateral limb was fully deployed.  In order to extend down into the external iliac artery further for better seal I then advanced the 16 French sheath using the dilator into the graft.  We then selected a 12 mm x 7 cm extension limb on the right which was positioned with adequate overlap into the  ipsilateral limb.  The sheath was retracted and this extension limb was then deployed without difficulty.  We then ballooned the proximal graft and all overlap regions in the distal graft with the MOB balloon.  The Omni Flush catheter was then placed just above the renal arteries and a completion arteriogram was obtained which showed excellent position of the graft with no endoleak is noted.  The heparin was partially reversed with protamine.  The Perclose devices  were then secured with good hemostasis and the wires were removed.  The sutures were secured again and cut.  The small skin incisions were closed with 4-0 Monocryl.  Both feet appear warm and well-perfused.  The patient tolerated the procedure well was transferred to the recovery room in stable condition.  All needle and sponge counts were correct.  George Ferrarihristopher Canuto Kingston, MD, FACS [facility]  DATE OF DICTATION:   11/18/2017

## 2017-11-18 NOTE — Plan of Care (Signed)
  Education: Knowledge of General Education information will improve 11/18/2017 1851 - Progressing by Loel LoftyLewis, Brier Reid P, RN

## 2017-11-18 NOTE — Anesthesia Postprocedure Evaluation (Signed)
Anesthesia Post Note  Patient: George Salazar  Procedure(s) Performed: ABDOMINAL AORTIC ENDOVASCULAR STENT GRAFT (N/A )     Patient location during evaluation: PACU Anesthesia Type: General Level of consciousness: awake and alert Pain management: pain level controlled Vital Signs Assessment: post-procedure vital signs reviewed and stable Respiratory status: spontaneous breathing, nonlabored ventilation, respiratory function stable and patient connected to nasal cannula oxygen Cardiovascular status: blood pressure returned to baseline and stable Postop Assessment: no apparent nausea or vomiting Anesthetic complications: no    Last Vitals:  Vitals:   11/18/17 1106 11/18/17 1130  BP: 134/65   Pulse: (!) 58   Resp: 13   Temp:  36.8 C  SpO2: 100%     Last Pain:  Vitals:   11/18/17 0604  TempSrc: Oral                 Dennice Tindol COKER

## 2017-11-18 NOTE — Addendum Note (Signed)
Addendum  created 11/18/17 1150 by Army FossaPulliam, Zelma Snead Dane, CRNA   Intraprocedure Event edited, Intraprocedure Meds edited

## 2017-11-18 NOTE — Anesthesia Procedure Notes (Signed)
Arterial Line Insertion Start/End1/08/2018 7:05 AM, 11/18/2017 7:37 AM Performed by: Kipp BroodJoslin, David, MD, Army FossaPulliam, Tamikia Chowning Dane, CRNA, anesthesiologist  Patient location: Pre-op. Preanesthetic checklist: patient identified, IV checked, site marked, risks and benefits discussed, surgical consent, monitors and equipment checked, pre-op evaluation, timeout performed and anesthesia consent Lidocaine 1% used for infiltration Right, radial was placed Catheter size: 20 G Hand hygiene performed , maximum sterile barriers used  and Seldinger technique used Allen's test indicative of satisfactory collateral circulation Attempts: 3 (CRNA attempted X 2. Dr. Noreene LarssonJoslin placed with ultrasound.  ) Procedure performed using ultrasound guided technique. Ultrasound Notes:anatomy identified, needle tip was noted to be adjacent to the nerve/plexus identified and no ultrasound evidence of intravascular and/or intraneural injection Following insertion, dressing applied and Biopatch. Post procedure assessment: normal and unchanged  Patient tolerated the procedure well with no immediate complications.

## 2017-11-18 NOTE — H&P (Signed)
REASON FOR ADMISSION:   4.6 cm right iliac artery aneurysm.  HPI:   George Salazar is a pleasant 72 y.o. 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  I felt that this was a size that we would need to consider elective repair.  The patient had undergone a screening ultrasound that showed a large right common iliac artery aneurysm and a very small abdominal aortic aneurysm.  He denies any family history of aneurysmal disease.  He denies any abdominal pain.  He does have some chronic low back pain.  He has had no abdominal pain or back pain.       Past Medical History:  Diagnosis Date  . Hyperlipidemia   . Hypertension   . OA (osteoarthritis)     History reviewed. No pertinent family history.    SOCIAL HISTORY: Social History       Tobacco Use  . Smoking status: Current Some Day Smoker  . Smokeless tobacco: Never Used  . Tobacco comment: 1 pk lasts 2 wks.  Substance Use Topics  . Alcohol use: Not on file        Allergies  Allergen Reactions  . Plavix [Clopidogrel Bisulfate]           Current Outpatient Medications  Medication Sig Dispense Refill  . FLUoxetine (PROZAC) 20 MG capsule Take 20 mg every morning by mouth.    Marland Kitchen HYDROcodone-acetaminophen (NORCO) 10-325 MG tablet Take 1 tablet 3 (three) times daily by mouth.    . Multiple Vitamins-Minerals (CENTRUM ADULTS) TABS Take by mouth.    . Omega-3 Fatty Acids (FISH OIL) 1200 MG CAPS Take by mouth.    . ramipril (ALTACE) 5 MG capsule Take 5 mg daily by mouth.    . simvastatin (ZOCOR) 40 MG tablet Take 40 mg daily by mouth.    . Vitamin D, Ergocalciferol, (DRISDOL) 50000 units CAPS capsule Take 50,000 Units 2 (two) times a week by mouth.     No current facility-administered medications for this visit.     REVIEW OF SYSTEMS:  [X]  denotes positive finding, [ ]  denotes negative finding Cardiac  Comments:  Chest pain or chest pressure:     Shortness of breath upon exertion:    Short of breath when lying flat:    Irregular heart rhythm:        Vascular    Pain in calf, thigh, or hip brought on by ambulation:    Pain in feet at night that wakes you up from your sleep:     Blood clot in your veins:    Leg swelling:         Pulmonary    Oxygen at home:    Productive cough:     Wheezing:         Neurologic    Sudden weakness in arms or legs:     Sudden numbness in arms or legs:     Sudden onset of difficulty speaking or slurred speech:    Temporary loss of vision in one eye:     Problems with dizziness:         Gastrointestinal    Blood in stool:     Vomited blood:         Genitourinary    Burning when urinating:     Blood in urine:        Psychiatric    Major depression:         Hematologic  Bleeding problems:    Problems with blood clotting too easily:        Skin    Rashes or ulcers:        Constitutional    Fever or chills:     PHYSICAL EXAM:      Vitals:   10/13/17 1458  BP: 107/62  Pulse: 76  Resp: 20  Temp: 98.4 F (36.9 C)  TempSrc: Oral  SpO2: 96%  Weight: 222 lb (100.7 kg)  Height: 6\' 1"  (1.854 m)    GENERAL: The patient is a well-nourished male, in no acute distress. The vital signs are documented above. CARDIAC: There is a regular rate and rhythm.  VASCULAR: I do not detect carotid bruits. Patient has palpable femoral, popliteal, dorsalis pedis, posterior tibial pulses bilaterally. PULMONARY: There is good air exchange bilaterally without wheezing or rales. ABDOMEN: Soft and non-tender with normal pitched bowel sounds.  MUSCULOSKELETAL: There are no major deformities or cyanosis. NEUROLOGIC: No focal weakness or paresthesias are detected. SKIN: There are no ulcers or rashes noted. PSYCHIATRIC: The patient has a normal affect.  DATA:    CT ANGIOGRAM ABDOMEN PELVIS: The  maximum diameter of the right common iliac artery aneurysm is 4.4 cm.  The maximum diameter of the infrarenal aorta is 2.8 cm.  There is significant calcium in the right internal iliac artery and this is somewhat small.  The left common iliac artery is not aneurysmal.  MEDICAL ISSUES:   4.4 CM RIGHT COMMON ILIAC ARTERY ANEURYSM: This patient has a 4.4 cm right common iliac artery aneurysm.  I have explained that we would normally recommend elective repair at 3.5 cm for an iliac artery aneurysm.  This reason I have recommended elective repair.  The aneurysm extends up to the aortic bifurcation so this would have to be repaired with an aortoiliac endovascular stent graft.  I do not think that he is a good candidate for iliac branch device on the right given that the internal iliac artery is small and severely calcified.  I have coil embolized the right internal iliac artery.  He has undergone preop cardiac clearance. He presents for elective repair of his right CIA aneurysm. We have discussed the indications for the procedure and the potential complications. All of his questions were answered and he is agreeable to proceed.   Waverly Ferrarihristopher Dickson [facility] [phone]

## 2017-11-18 NOTE — Anesthesia Procedure Notes (Signed)
Procedure Name: Intubation Date/Time: 11/18/2017 7:57 AM Performed by: Freddie Breech, CRNA Pre-anesthesia Checklist: Patient identified, Emergency Drugs available, Suction available and Patient being monitored Patient Re-evaluated:Patient Re-evaluated prior to induction Oxygen Delivery Method: Circle System Utilized Preoxygenation: Pre-oxygenation with 100% oxygen Induction Type: IV induction Ventilation: Mask ventilation without difficulty Laryngoscope Size: Mac and 4 Grade View: Grade I Tube type: Oral Tube size: 7.5 mm Number of attempts: 1 Airway Equipment and Method: Stylet and Oral airway Placement Confirmation: ETT inserted through vocal cords under direct vision,  positive ETCO2 and breath sounds checked- equal and bilateral Secured at: 23 cm Tube secured with: Tape Dental Injury: Teeth and Oropharynx as per pre-operative assessment

## 2017-11-18 NOTE — Op Note (Signed)
OPERATIVE NOTE   PROCEDURE: 1. left common femoral artery cannulation under ultrasound guidance 2. "Preclose" repair of left common femoral artery, i.e. proglide placement x 2 3. Placement of contralateral iliac limb (Gore 18 mm x 11.5 cm)  PRE-OPERATIVE DIAGNOSIS: large abdominal aortic aneurysm, right common iliac artery aneurysm  POST-OPERATIVE DIAGNOSIS: same as above   CO-SURGEONS:  Dr. Cari Caraway; Leonides Sake, MD,   ANESTHESIA: general  ESTIMATED BLOOD LOSS: see Dr. Adele Dan note  FINDING(S): 1.  Successful exclusion of aneurysm at end of case  2.  Endoleaks: none 3.  Bilateral patent renal arteries and left internal iliac artery 4.  Right internal iliac artery occluded with coils evident  SPECIMEN(S):  none  INDICATIONS:   George Salazar is a 72 y.o. ([birth year]) male who presents with large abdominal aortic aneurysm and large right common iliac artery aneurysm.  Dr. Edilia Bo recommended endovascular aortic repair.  The patient is aware the risks of endovascular aortic surgery include but are not limited to: bleeding, need for transfusion, infection, death, stroke, paralysis, wound complications, spinal, pelvic and bowel ischemia, extended ventilation, anaphylactic reaction to contrast, contrast induced nephropathy, embolism, and need for additional procedure to address endoleaks.  Overall, a mortality rate of 1-2% and morbidity rate of 15% was cited.  The patient is aware of the risks and agree to proceed.   DESCRIPTION: After obtaining full informed written consent, the patient was brought back to the operating room and placed supine upon the operating table.  The patient received IV antibiotics prior to induction.  A procedure time out was completed and the correct surgical site was verified.  After obtaining adequate anesthesia, the patient was prepped and draped in the standard fashion for: open or endovascular aortic procedure.  A two surgeon  technique was utilized to maintain constant control of the main body of the endograft and to expedite completion of this case.  Ipsilateral Access See Dr. Adele Dan note for details of ipsilateral access.  Contralateral Access I turned my attention to the left groin  Under ultrasound guidance,  the common femoral artery was then cannulated with a 18 gauge needle.  The Bentson wire was passed up into the aorta.  The needle was exchanged for a 8-Fr dilator, which was used to dilate the subcutaneous tract and arterial puncture.  The dilator was exchanged for a Proglide device.  This was deployed at 30 degrees medial rotation.  The Proglide sutures were tagged and then the Bentson wire was reloaded in the used Proglide device.  The used device was exchanged for a new Proglide device.  The new device was deployed at 30 degrees lateral rotation.  The new Proglide sutures were tagged and then the Bentson wire was reloaded in the Proglide device.  The device was exchanged for a long 8-Fr sheath.  In this fashion, the initial stage of the "Preclose" technique repair of the common femoral artery was completed.    Heparinization and Placement of Main Body of Endograft and Initial Aortogram See Dr. Adele Dan note for details on anticoagulation and placement of main body of endograft.  I steered a Bentson wire into the suprarenal aorta with the assistance of a BER-2 catheter.  The catheter was exchanged for a marker pigtail catheter.  The catheter was placed in the suprarenal segment.  The wire was removed and the catheter was connected to the power injector circuit.  The catheter was de-airred and de-cloted.  A power injector aortogram was completed.  Dr. Edilia Bo  deployed the main body of the endograft in the infrarenal position (see his note for details).  Contralateral Gate Cannnulation At this point, I exchanged the pigtail catheter for a Bentson wire. Using this pigtail catheter and Bentson wire, I was able to  cannulate the contralateral gate.  I advanced the wire and catheter into the main body without difficulty.  The wire was exchanged for the Amplatz wire.  I exchanged the catheter for a pigtail catheter.  I pulled the Amplatz wire back and allowed the crook of the catheter to reform.  I spun the formed catheter at the top of the main body, demonstrating intragraft location.  I replaced the Amplatz wire in the descending thoracic aorta and then pulled the marker pigtail catheter down to the flow divider.    Contralateral Limb Placement I then did a right anterior oblique pelvic injection to image the takeoff of the contralateral internal iliac artery.  Based on the measurements, I selected a 18 mm x 11.5 cm iliac limb.  The contralateral sheath was exchanged for a 12-Fr Dryseal sheath.  The iliac limb was advanced into the main body.  This limb was deployed with adequate overlap.  The stent delivery device was removed.  Ipsilateral Limb Extension At this point, Dr. Edilia Bo fully deployed the ipsilateral limb and removed the main body stent delivery device.  Based on the images, ipsilateral limb extension was needed. He selected a right iliac limb extension and deployed it.  See Dr. Adele Dan note for detail.  Aortic and Limb Molding The molding and occlusion balloon was loaded on the contralateral wire and advanced into the main body.  I molded the proximal graft, overlapping segments just distal to the flow divider and the distal extent of the graft in the contralateral iliac artery.  The balloon was deflated and placed on the ipsilateral wire.  Dr. Edilia Bo inflated the balloon at the overlap in the right iliac limb and  distal extent of the ipsilateral iliac artery.  The balloon was deflated and removed.  Completion Aortogram I replaced the pigtail catheter was replaced on the contralateral wire and advanced into the suprarenal aorta.  The wire was removed and the catheter was connected to the power  injector circuit.  A power injector aortogram was completed: noendoleak was evident with intact bilateral renal artery flow and left internal iliac artery flow.  The right internal iliac artery was occluded by coils previously placed.  I replaced the Bentson wire into the catheter, straightening out the crook in the catheter.  I removed the catheter.  Dr. Edilia Bo then placed a BER-2 catheter over the ipsilateral wire and then exchanged the wire for a Bentson wire.    Completion of Pre-close Repair of Contralateral Common Femoral Artery At this point, Dr. Edilia Bo gave Protamine to reverse anticoagulation (see his note for details).  I turned my attention to the ipsilateral groin.  Dr. Edilia Bo held pressure proximally and distally on this common femoral artery as I removed the femoral sheath.  There was no change in blood pressure with this maneuver.  I then sequentially tightened the previously tagged Proglide sutures.  There was minimal bleeding with the wire still in place, so I removed the wire.  I sequentially tightened the previous tagged Proglide sutures again and locked each set of sutures by applying tension to the locking stitch in each pair.  In this fashion, I repaired the contralateral common femoral artery.  This exact same process was completed by Dr. Edilia Bo in  the ipsilateral groin, after removing the sheath on that side.  Hemostats were applied to the sutures in both groin.    After waiting a few minutes, I transected the sutures in the contralateral groin.  Dr. Edilia Boickson did the same with the sutures in the ipsilateral groin.  The skin incisions in both groin were repaired with U-stitches of 4-0 Vicryl.  The skin was cleaned, dried, and Dermabond used to reinforce the skin closures.  A sterile dressing was applied to both groins.   Leonides SakeBrian Chen, MD, FACS [facility] [place] Office: [phone] Pager: [phone]  11/18/2017, 9:19 AM

## 2017-11-18 NOTE — Transfer of Care (Signed)
Immediate Anesthesia Transfer of Care Note  Patient: George Salazar  Procedure(s) Performed: ABDOMINAL AORTIC ENDOVASCULAR STENT GRAFT (N/A )  Patient Location: PACU  Anesthesia Type:General  Level of Consciousness: drowsy and patient cooperative  Airway & Oxygen Therapy: Patient Spontanous Breathing and Patient connected to face mask oxygen  Post-op Assessment: Report given to RN and Post -op Vital signs reviewed and stable  Post vital signs: Reviewed and stable  Last Vitals:  Vitals:   11/18/17 0604  BP: (!) 116/54  Pulse: (!) 56  Resp: 20  Temp: 36.6 C  SpO2: 97%    Last Pain:  Vitals:   11/18/17 0604  TempSrc: Oral      Patients Stated Pain Goal: 3 (11/18/17 [id number])  Complications: No apparent anesthesia complications

## 2017-11-19 ENCOUNTER — Encounter (HOSPITAL_COMMUNITY): Payer: Self-pay | Admitting: Vascular Surgery

## 2017-11-19 LAB — CBC
HCT: 32.6 % — ABNORMAL LOW (ref 39.0–52.0)
Hemoglobin: 10.4 g/dL — ABNORMAL LOW (ref 13.0–17.0)
MCH: 30.8 pg (ref 26.0–34.0)
MCHC: 31.9 g/dL (ref 30.0–36.0)
MCV: 96.4 fL (ref 78.0–100.0)
Platelets: 190 10*3/uL (ref 150–400)
RBC: 3.38 MIL/uL — AB (ref 4.22–5.81)
RDW: 12.3 % (ref 11.5–15.5)
WBC: 11.9 10*3/uL — AB (ref 4.0–10.5)

## 2017-11-19 LAB — BASIC METABOLIC PANEL
ANION GAP: 8 (ref 5–15)
BUN: 16 mg/dL (ref 6–20)
CO2: 23 mmol/L (ref 22–32)
Calcium: 8.9 mg/dL (ref 8.9–10.3)
Chloride: 104 mmol/L (ref 101–111)
Creatinine, Ser: 1.04 mg/dL (ref 0.61–1.24)
GLUCOSE: 120 mg/dL — AB (ref 65–99)
POTASSIUM: 4.3 mmol/L (ref 3.5–5.1)
SODIUM: 135 mmol/L (ref 135–145)

## 2017-11-19 MED ORDER — OXYCODONE-ACETAMINOPHEN 5-325 MG PO TABS: 1.0000 | ORAL_TABLET | Freq: Four times a day (QID) | ORAL | 0 refills | Status: DC | PRN

## 2017-11-19 NOTE — Plan of Care (Signed)
  Progressing Clinical Measurements: Ability to maintain clinical measurements within normal limits will improve 11/19/2017 0115 - Progressing by Olive BassFutrell, Fox Salminen E, RN Will remain free from infection 11/19/2017 0115 - Progressing by Olive BassFutrell, Tanisha Lutes E, RN Diagnostic test results will improve 11/19/2017 0115 - Progressing by Olive BassFutrell, Pasha Gadison E, RN Respiratory complications will improve 11/19/2017 0115 - Progressing by Olive BassFutrell, Una Yeomans E, RN Cardiovascular complication will be avoided 11/19/2017 0115 - Progressing by Olive BassFutrell, Marleigh Kaylor E, RN Activity: Risk for activity intolerance will decrease 11/19/2017 0115 - Progressing by Olive BassFutrell, Lorriann Hansmann E, RN Elimination: Will not experience complications related to urinary retention 11/19/2017 0115 - Progressing by Olive BassFutrell, Breton Berns E, RN Safety: Ability to remain free from injury will improve 11/19/2017 0115 - Progressing by Olive BassFutrell, Leeon Makar E, RN Skin Integrity: Risk for impaired skin integrity will decrease 11/19/2017 0115 - Progressing by Olive BassFutrell, Naiomy Watters E, RN

## 2017-11-19 NOTE — Plan of Care (Signed)
  Education: Knowledge of General Education information will improve 11/19/2017 1103 - Progressing by Loel LoftyLewis, Birdena Kingma P, RN

## 2017-11-19 NOTE — Discharge Instructions (Signed)
   [facility] [place]   Discharge Instructions  Endovascular Aortic Aneurysm Repair  Please refer to the following instructions for your post-procedure care. Your surgeon or Physician Assistant will discuss any changes with you.  Activity  You are encouraged to walk as much as you can. You can slowly return to normal activities but must avoid strenuous activity and heavy lifting until your doctor tells you it's OK. Avoid activities such as vacuuming or swinging a gold club. It is normal to feel tired for several weeks after your surgery. Do not drive until your doctor gives the OK and you are no longer taking prescription pain medications. It is also normal to have difficulty with sleep habits, eating, and bowel movements after surgery. These will go away with time.  Bathing/Showering  You may shower after you go home. If you have an incision, do not soak in a bathtub, hot tub, or swim until the incision heals completely.  Incision Care  Shower every day. Clean your incision with mild soap and water. Pat the area dry with a clean towel. You do not need a bandage unless otherwise instructed. Do not apply any ointments or creams to your incision. If you clothing is irritating, you may cover your incision with a dry gauze pad.  Diet  Resume your normal diet. There are no special food restrictions following this procedure. A low fat/low cholesterol diet is recommended for all patients with vascular disease. In order to heal from your surgery, it is CRITICAL to get adequate nutrition. Your body requires vitamins, minerals, and protein. Vegetables are the best source of vitamins and minerals. Vegetables also provide the perfect balance of protein. Processed food has little nutritional value, so try to avoid this.  Medications  Resume taking all of your medications unless your doctor or nurse practitioner tells you not to. If your incision is causing pain, you may take  over-the-counter pain relievers such as acetaminophen (Tylenol). If you were prescribed a stronger pain medication, please be aware these medications can cause nausea and constipation. Prevent nausea by taking the medication with a snack or meal. Avoid constipation by drinking plenty of fluids and eating foods with a high amount of fiber, such as fruits, vegetables, and grains. Do not take Tylenol if you are taking prescription pain medications.   Follow up  Our office will schedule a follow-up appointment with a C.T. scan 3-4 weeks after your surgery.  Please call us immediately for any of the following conditions  Severe or worsening pain in your legs or feet or in your abdomen back or chest. Increased pain, redness, drainage (pus) from your incision sit. Increased abdominal pain, bloating, nausea, vomiting or persistent diarrhea. Fever of 101 degrees or higher. Swelling in your leg (s),  Reduce your risk of vascular disease  Stop smoking. If you would like help call [facility] at 1-800-QUIT-NOW (1-800-784-8669) or [place] at [phone]. Manage your cholesterol Maintain a desired weight Control your diabetes Keep your blood pressure down  If you have questions, please call the office at [phone].   

## 2017-11-19 NOTE — Progress Notes (Signed)
Orders received for pt discharge.  Discharge summary printed and reviewed with pt.  Explained medication regimen, and pt had no further questions at this time.  IV removed and site remains clean, dry, intact.  Telemetry removed.  Pt in stable condition and awaiting transport. 

## 2017-11-19 NOTE — Discharge Summary (Signed)
EVAR Discharge Summary   George Salazar 1945-11-25 72 y.o. male  MRN: [id number]  Admission Date: 11/18/2017  Discharge Date: 11/19/17  Physician: Dr. Edilia Bo  Admission Diagnosis: Aneurysm of right iliac artery [facility]) [I72.3]  Discharge Day services:  See progress note 11/19/17   Physical Exam: Vitals:   11/19/17 0321 11/19/17 0524  BP: 116/62 127/60  Pulse: 66 65  Resp:  18  Temp:  98 F (36.7 C)  SpO2:  99%    Hospital Course:  The patient was admitted to the hospital and taken to the operating room on 11/18/2017 and underwent: Percutaneous endovascular repair of 4.6 cm right common iliac artery aneurysm.    The pt tolerated the procedure well and was transported to the PACU in good condition.   POD #1 patient is ambulating without difficulty, tolerating a home diet, and feeling fit for discharge.  Morning drawn labs do not demonstrate any significant electrolyte abnormality or drop in hemoglobin.  Bilateral groin access sites without palpable hematoma.  The remainder of the hospital course consisted of increasing mobilization and increasing intake of solids without difficulty.  He will be prescribed #8 5/325 mg Percocet for continued postoperative pain control.  He will follow-up in office in about 1 month with a CTA abdomen pelvis.  Discharge instructions were reviewed with the patient and he voices his understanding.  He will be discharged this morning in stable condition.  CBC    Component Value Date/Time   WBC 11.9 (H) 11/19/2017 0412   RBC 3.38 (L) 11/19/2017 0412   HGB 10.4 (L) 11/19/2017 0412   HCT 32.6 (L) 11/19/2017 0412   PLT 190 11/19/2017 0412   MCV 96.4 11/19/2017 0412   MCH 30.8 11/19/2017 0412   MCHC 31.9 11/19/2017 0412   RDW 12.3 11/19/2017 0412    BMET    Component Value Date/Time   NA 135 11/19/2017 0412   K 4.3 11/19/2017 0412   CL 104 11/19/2017 0412   CO2 23 11/19/2017 0412   GLUCOSE 120 (H) 11/19/2017 0412   BUN 16  11/19/2017 0412   CREATININE 1.04 11/19/2017 0412   CALCIUM 8.9 11/19/2017 0412   GFRNONAA >60 11/19/2017 0412   GFRAA >60 11/19/2017 0412         Discharge Diagnosis:  Aneurysm of right iliac artery (HCC) [I72.3]  Secondary Diagnosis: Patient Active Problem List   Diagnosis Date Noted  . AAA (abdominal aortic aneurysm) (HCC) 11/18/2017  . Aneurysm of right iliac artery (HCC) 11/18/2017  . 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 Dyspnea on minimal exertion 10/28/2017  . History of CVA (cerebrovascular accident) 10/28/2017  . Preoperative cardiovascular examination 10/28/2017  . Essential hypertension 10/28/2017  . Hyperlipidemia with target LDL less than 70 10/28/2017  . Cigarette smoker 10/28/2017  . Aneurysm of right common iliac artery (HCC) 09/09/2017   Past Medical History:  Diagnosis Date  . 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 Cigarette smoker 10/28/2017  . Hyperlipidemia   . Hypertension   . Lower back pain   . OA (osteoarthritis)   . Stroke [facility])    Unclear of details     Allergies as of 11/19/2017      Reactions   Plavix [clopidogrel Bisulfate]    Passed out, redness       Medication List    TAKE these medications   aspirin EC 81 MG tablet Take 81 mg by mouth daily.   citalopram 20 MG tablet Commonly known as:  CELEXA Take 20 mg by mouth daily.   diphenhydramine-acetaminophen 25-500 MG Tabs tablet Commonly known as:  TYLENOL PM Take 2 tablets by mouth at bedtime.   Fish Oil 1200 MG Caps Take 1,200 mg by mouth daily.   FLUoxetine 20 MG capsule Commonly known as:  PROZAC Take 20 mg by mouth daily.   HYDROcodone-acetaminophen 10-325 MG tablet Commonly known as:  NORCO Take 1 tablet by mouth 3 (three) times daily as needed for moderate pain.   multivitamin with minerals Tabs tablet Take 1 tablet by mouth daily. Centrum Multivitamin.   oxyCODONE-acetaminophen 5-325 MG tablet Commonly known as:   PERCOCET/ROXICET Take 1-2 tablets by mouth every 6 (six) hours as needed for moderate pain.   ramipril 5 MG capsule Commonly known as:  ALTACE Take 5 mg daily by mouth.   simvastatin 40 MG tablet Commonly known as:  ZOCOR Take 40 mg daily by mouth.   Vitamin D (Ergocalciferol) 50000 units Caps capsule Commonly known as:  DRISDOL Take 50,000 Units 2 (two) times a week by mouth.       [facility]  Discharge Instructions Endovascular Aortic Aneurysm Repair  Please refer to the following instructions for your post-procedure care. Your surgeon or Physician Assistant will discuss any changes with you.  Activity  You are encouraged to walk as much as you can. You can slowly return to normal activities but must avoid strenuous activity and heavy lifting until your doctor tells you it's OK. Avoid activities such as vacuuming or swinging a gold club. It is normal to feel tired for several weeks after your surgery. Do not drive until your doctor gives the OK and you are no longer taking prescription pain medications. It is also normal to have difficulty with sleep habits, eating, and bowel movements after surgery. These will go away with time.  Bathing/Showering  You may shower after you go home. If you have an incision, do not soak in a bathtub, hot tub, or swim until the incision heals completely.  Incision Care  Shower every day. Clean your incision with mild soap and water. Pat the area dry with a clean towel. You do not need a bandage unless otherwise instructed. Do not apply any ointments or creams to your incision. If you clothing is irritating, you may cover your incision with a dry gauze pad.  Diet  Resume your normal diet. There are no special food restrictions following this procedure. A low fat/low cholesterol diet is recommended for all patients with vascular disease. In order to heal from your surgery, it is CRITICAL to get adequate nutrition.  Your body requires vitamins, minerals, and protein. Vegetables are the best source of vitamins and minerals. Vegetables also provide the perfect balance of protein. Processed food has little nutritional value, so try to avoid this.  Medications  Resume taking all of your medications unless your doctor or Physician Assistnat tells you not to. If your incision is causing pain, you may take over-the-counter pain relievers such as acetaminophen (Tylenol). If you were prescribed a stronger pain medication, please be aware these medications can cause nausea and constipation. Prevent nausea by taking the medication with a snack or meal. Avoid constipation by drinking plenty of fluids and eating foods with a high amount of fiber, such as fruits, vegetables, and grains. Do not take Tylenol if you are taking prescription pain medications.   Follow up  Our office will schedule a follow-up appointment with a C.T. scan 3-4 weeks  after your surgery.  Please call us immediately for any of the following conditions  Severe or worsening pain in your legs or feet or in your abdomen back or chest. Increased pain, redness, drainage (pus) from your incision sit. Increased abdominal pain, bloating, nausea, vomiting or persistent diarrhea. Fever of 101 degrees or higher. Swelling in your leg (s),  Reduce your risk of vascular disease  .Stop smoking. If you would like help call [facility] at 1-800-QUIT-NOW (603-451-5303) or [place] at 586-137-2137. .Manage your cholesterol .Maintain a desired weight .Control your diabetes .Keep your blood pressure down  If you have questions, please call the office at [phone].    Prescriptions given: 5/325mg  percocet #8 No Refill  Disposition: home  Patient's condition: is Good  Follow up: 1. Dr. Edilia Bo in 4 weeks with CTA protocol   Emilie Rutter, PA-C [facility] [phone] 11/19/2017  3:59 PM   - For VQI Registry use  - Post-op:  Time to Extubation: [x]  In OR, [ ]  < 12 hrs, [ ]  12-24 hrs, [ ]  >=24 hrs Vasopressors Req. Post-op: No MI: No., [ ]  Troponin only, [ ]  EKG or Clinical New Arrhythmia: No CHF: No Transfusion: No     If yes,  units given  Complications: Resp failure: No., [ ]  Pneumonia, [ ]  Ventilator Chg in renal function: No., [ ]  Inc. Cr > 0.5, [ ]  Temp. Dialysis,  [ ]  Permanent dialysis Leg ischemia: No., no Surgery needed, [ ]  Yes, Surgery needed,  [ ]  Amputation Bowel ischemia: No., [ ]  Medical Rx, [ ]  Surgical Rx Wound complication: No., [ ]  Superficial separation/infection, [ ]  Return to OR Return to OR: No  Return to OR for bleeding: No Stroke: No., [ ]  Minor, [ ]  Major  Discharge medications: Statin use:  Yes  ASA use:  Yes  Plavix use:  No  Beta blocker use:  No  ARB use:  No ACEI use:  Yes CCB use:  No

## 2017-11-19 NOTE — Progress Notes (Signed)
   VASCULAR SURGERY ASSESSMENT & PLAN:   1 Day Post-Op s/p: Percutaneous endovascular aneurysm repair  Ready for discharge this morning.  I have ordered a follow-up CT angiogram of the abdomen and pelvis in 1 month and office visit at that time.  He is on aspirin and is on a statin.  SUBJECTIVE:   No complaints.  Wants to go home.  PHYSICAL EXAM:   Vitals:   11/18/17 2010 11/19/17 0000 11/19/17 0321 11/19/17 0524  BP: (!) 144/89 (!) 103/41 116/62 127/60  Pulse: 70 70 66 65  Resp: 18 18  18   Temp: 98.2 F (36.8 C) 97.9 F (36.6 C)  98 F (36.7 C)  TempSrc: Oral Oral  Oral  SpO2: 98% 96%  99%  Weight:    219 lb 3.2 oz (99.4 kg)  Height:       Both groins sites look fine. Both feet are warm and well-perfused.  LABS:   Lab Results  Component Value Date   WBC 11.9 (H) 11/19/2017   HGB 10.4 (L) 11/19/2017   HCT 32.6 (L) 11/19/2017   MCV 96.4 11/19/2017   PLT 190 11/19/2017   Lab Results  Component Value Date   CREATININE 1.04 11/19/2017   Lab Results  Component Value Date   INR 1.19 11/18/2017    PROBLEM LIST:    Active Problems:   AAA (abdominal aortic aneurysm) (HCC)   Aneurysm of right iliac artery (HCC)   CURRENT MEDS:   . aspirin EC  81 mg Oral Daily  . citalopram  20 mg Oral Daily  . docusate sodium  100 mg Oral Daily  . enoxaparin (LOVENOX) injection  40 mg Subcutaneous Q24H  . FLUoxetine  20 mg Oral Daily  . multivitamin with minerals  1 tablet Oral Daily  . pantoprazole  40 mg Oral Daily  . ramipril  5 mg Oral Daily  . simvastatin  40 mg Oral Daily  . Vitamin D (Ergocalciferol)  50,000 Units Oral Once per day on Mon Thu    Waverly FerrariChristopher Marykathryn Carboni Beeper: [phone] Office: [phone] 11/19/2017

## 2017-11-23 ENCOUNTER — Telehealth: Payer: Self-pay | Admitting: Vascular Surgery

## 2017-11-23 ENCOUNTER — Other Ambulatory Visit: Payer: Self-pay

## 2017-11-23 DIAGNOSIS — I723 Aneurysm of iliac artery: Secondary | ICD-10-CM

## 2017-11-23 DIAGNOSIS — I724 Aneurysm of artery of lower extremity: Secondary | ICD-10-CM

## 2017-11-23 DIAGNOSIS — Z48812 Encounter for surgical aftercare following surgery on the circulatory system: Secondary | ICD-10-CM

## 2017-11-23 NOTE — Telephone Encounter (Signed)
Sched appts for 12/22/17. [facility]. MD at 3:00. Spoke to pt's wife to give them instructions.

## 2017-11-23 NOTE — Telephone Encounter (Signed)
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 Edilia Boickson, MD; Leonides SakeBrian Chen, MD  He will need a follow-up CT angiogram of the abdomen and pelvis 1 month postop with an office visit at that time.  Thank you.

## 2017-12-07 ENCOUNTER — Telehealth: Payer: Self-pay | Admitting: Cardiology

## 2017-12-07 NOTE — Telephone Encounter (Signed)
Received incoming records from Dr. Donnel SaxonImran Haque. Records placed in Dr. Elissa HeftyHarding's box.

## 2017-12-22 ENCOUNTER — Encounter: Payer: Self-pay | Admitting: Vascular Surgery

## 2017-12-22 ENCOUNTER — Ambulatory Visit (INDEPENDENT_AMBULATORY_CARE_PROVIDER_SITE_OTHER): Payer: Medicare HMO | Admitting: Vascular Surgery

## 2017-12-22 ENCOUNTER — Ambulatory Visit
Admission: RE | Admit: 2017-12-22 | Discharge: 2017-12-22 | Disposition: A | Discharge status: Home / Self Care | Payer: Medicare HMO | Source: Ambulatory Visit | Attending: Vascular Surgery | Admitting: Vascular Surgery

## 2017-12-22 ENCOUNTER — Other Ambulatory Visit: Payer: Self-pay

## 2017-12-22 VITALS — BP 111/52 | HR 64 | Temp 97.3°F | Resp 18 | Ht 73.0 in | Wt 220.0 lb

## 2017-12-22 DIAGNOSIS — Z48812 Encounter for surgical aftercare following surgery on the circulatory system: Secondary | ICD-10-CM

## 2017-12-22 DIAGNOSIS — I723 Aneurysm of iliac artery: Secondary | ICD-10-CM

## 2017-12-22 DIAGNOSIS — I714 Abdominal aortic aneurysm, without rupture, unspecified: Secondary | ICD-10-CM

## 2017-12-22 DIAGNOSIS — I724 Aneurysm of artery of lower extremity: Secondary | ICD-10-CM

## 2017-12-22 MED ORDER — IOPAMIDOL (ISOVUE-370) INJECTION 76%
75.0000 mL | Freq: Once | INTRAVENOUS | Status: AC | PRN
  Administered 2017-12-22: 75 mL via INTRAVENOUS

## 2017-12-22 NOTE — Progress Notes (Signed)
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  HPI:   George Demarklvin Dallas Vargus is a pleasant 72 y.o. 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  He underwent preoperative coil embolization of the right internal iliac artery and then subsequently had percutaneous endovascular aneurysm repair using 3 components on 11/18/2017.  He presents for his 1 month follow-up visit.  Today he has no specific complaints.  He denies abdominal pain or back pain.  He is on aspirin and is on a statin.  Current Outpatient Medications  Medication Sig Dispense Refill  . aspirin EC 81 MG tablet Take 81 mg by mouth daily.    . citalopram (CELEXA) 20 MG tablet Take 20 mg by mouth daily.    . diphenhydramine-acetaminophen (TYLENOL PM) 25-500 MG TABS tablet Take 2 tablets by mouth at bedtime.    Marland Kitchen. FLUoxetine (PROZAC) 20 MG capsule Take 20 mg by mouth daily.     Marland Kitchen. HYDROcodone-acetaminophen (NORCO) 10-325 MG tablet Take 1 tablet by mouth 3 (three) times daily as needed for moderate pain.     . Multiple Vitamin (MULTIVITAMIN WITH MINERALS) TABS tablet Take 1 tablet by mouth daily. Centrum Multivitamin.    . Omega-3 Fatty Acids (FISH OIL) 1200 MG CAPS Take 1,200 mg by mouth daily.     Marland Kitchen. oxyCODONE-acetaminophen (PERCOCET/ROXICET) 5-325 MG tablet Take 1-2 tablets by mouth every 6 (six) hours as needed for moderate pain. 8 tablet 0  . ramipril (ALTACE) 5 MG capsule Take 5 mg daily by mouth.    . simvastatin (ZOCOR) 40 MG tablet Take 40 mg daily by mouth.    . Vitamin D, Ergocalciferol, (DRISDOL) 50000 units CAPS capsule Take 50,000 Units 2 (two) times a week by mouth.    . mupirocin ointment (BACTROBAN) 2 %      No current facility-administered medications for this visit.     REVIEW OF SYSTEMS:  [X]  denotes positive  finding, [ ]  denotes negative finding Cardiac  Comments:  Chest pain or chest pressure:    Shortness of breath upon exertion:    Short of breath when lying flat:    Irregular heart rhythm:    Constitutional    Fever or chills:     PHYSICAL EXAM:   Vitals:   12/22/17 1548  BP: (!) 111/52  Pulse: 64  Resp: 18  Temp: (!) 97.3 F (36.3 C)  TempSrc: Oral  SpO2: 97%  Weight: 220 lb (99.8 kg)  Height: 6\' 1"  (1.854 m)    GENERAL: The patient is a well-nourished male, in no acute distress. The vital signs are documented above. CARDIOVASCULAR: There is a regular rate and rhythm. PULMONARY: There is good air exchange bilaterally without wheezing or rales. ABDOMEN: Abdomen is soft and nontender. Both groins are soft without hematoma.  DATA:   CT ANGIOGRAM: I reviewed the images had a CT angiogram today.  These films have not yet been reviewed by radiology.  This shows that his abdominal aortic aneurysm and right common iliac artery aneurysm are stable in size.  There is a small type II endoleak related to a lumbar and the IMA.  The graft is in excellent position.  MEDICAL ISSUES:   STATUS POST PERCUTANEOUS ENDOVASCULAR REPAIR OF 4.6 CM RIGHT COMMON ILIAC ARTERY ANEURYSM: The patient is doing well status post  endovascular repair of his abdominal aortic aneurysm and right common iliac artery aneurysm.  I have ordered a follow-up ultrasound in 1 year and I will see him back at that time.  He knows to call sooner if he has problems.  He is on aspirin and is on a statin.  He is not a smoker.  Waverly Ferrari [facility] [phone]

## 2018-01-14 DIAGNOSIS — I10 Essential (primary) hypertension: Secondary | ICD-10-CM | POA: Diagnosis not present

## 2018-01-14 DIAGNOSIS — I639 Cerebral infarction, unspecified: Secondary | ICD-10-CM | POA: Diagnosis not present

## 2018-01-14 DIAGNOSIS — E785 Hyperlipidemia, unspecified: Secondary | ICD-10-CM

## 2018-01-15 DIAGNOSIS — I10 Essential (primary) hypertension: Secondary | ICD-10-CM | POA: Diagnosis not present

## 2018-01-15 DIAGNOSIS — I639 Cerebral infarction, unspecified: Secondary | ICD-10-CM | POA: Diagnosis not present

## 2018-01-15 DIAGNOSIS — E785 Hyperlipidemia, unspecified: Secondary | ICD-10-CM | POA: Diagnosis not present

## 2018-01-16 DIAGNOSIS — E785 Hyperlipidemia, unspecified: Secondary | ICD-10-CM | POA: Diagnosis not present

## 2018-01-16 DIAGNOSIS — I10 Essential (primary) hypertension: Secondary | ICD-10-CM | POA: Diagnosis not present

## 2018-01-16 DIAGNOSIS — I639 Cerebral infarction, unspecified: Secondary | ICD-10-CM | POA: Diagnosis not present

## 2018-03-17 ENCOUNTER — Encounter: Payer: Self-pay | Admitting: Neurology

## 2018-03-17 ENCOUNTER — Telehealth: Payer: Self-pay | Admitting: Neurology

## 2018-03-17 ENCOUNTER — Ambulatory Visit: Payer: Medicare HMO | Admitting: Neurology

## 2018-03-17 VITALS — BP 100/64 | HR 79 | Ht 73.0 in | Wt 220.0 lb

## 2018-03-17 DIAGNOSIS — R260 Ataxic gait: Secondary | ICD-10-CM | POA: Diagnosis not present

## 2018-03-17 DIAGNOSIS — H53483 Generalized contraction of visual field, bilateral: Secondary | ICD-10-CM

## 2018-03-17 NOTE — Progress Notes (Signed)
[facility] [address] [place]. [place] [address] [phone]       OFFICE CONSULT NOTE  George. George Salazar Date of Birth:  1946/06/06 Medical Record Number:  [id number]   Referring MD:  Caryl Comes Reason for Referral:  Stroke  HPI: George Salazar is a 24 year Caucasian male seen today for first office consultation visit following possible stroke in March 2019.  History is obtained from the patient, his wife and review of accompanying medical records.  I have personally reviewed his imaging films in canopy PACS patient presented to [facility] on 01/14/2018 with a 2-week history.  Of dysarthria, difficulty ambulating with poor balance as well as bilateral peripheral vision loss which she called tunnel vision.  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  Work-up at [facility] included an MRI scan of the brain which I personally reviewed which did not show any acute stroke but showed multiple chronic cortical and subcortical infarcts in the high right frontal and parietal lobes as compared with previous MRI from 2008 they appear to have increased.  Carotid ultrasound was also done on 01/16/2017 which showed less than 50% bilateral carotid stenosis.  Intracranial vascular imaging was not done.  Transthoracic echo was apparently done and was unremarkable but I do not have the reports to review.  Patient has history of allergy with Plavix and and developed a rash in the past.  Hence he was continued on aspirin.  Patient denies any prior history of atrial fibrillation, cardiac arrhythmias.  The patient continued to have mild balance difficulties and restricted peripheral vision upon discharge.  He has seen ophthalmologist Dr. Betsy Pries in [place] but I do not have his office notes for my review today.  Patient states his speech is improved.  His  balance still remains poor.  He has not been referred to physical occupational therapy yet.  He continues to smoke but is willing to quit smoking.  He does have a remote history of stroke 10 years ago but is unable to give me any details.  But looking at his MRI films I suspect he had right brain subcortical infarcts and has has residual mild left-sided weakness from that.  The patient has not been using a cane or a walker for assistance and has to hold onto objects to walk to avoid falling down.  Fortunately he has had no major falls or injuries.  He denies any emotional lability, bladder or bowel control problems. ROS:   14 system review of systems is positive for blurred vision, loss of vision, gait imbalance, dizziness, impotence, joint pain, runny nose, hearing loss and all other systems negative  PMH:  Past Medical History:  Diagnosis Date  . 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 Cigarette smoker 10/28/2017  . Hyperlipidemia   . Hypertension   . Lower back pain   . OA (osteoarthritis)   . Stroke [facility])    Unclear of details    Social History:  Social History   Socioeconomic History  . Marital status: Married    Spouse name: Not on file  . Number of children: 2  . Years of education: Not on file  . Highest education level: Not on file  Occupational History  . Not on file  Social Needs  . Financial resource strain: Not on file  . Food insecurity:    Worry:  Not on file    Inability: Not on file  . Transportation needs:    Medical: Not on file    Non-medical: Not on file  Tobacco Use  . Smoking status: Current Some Day Smoker    Packs/day: 0.25    Types: Cigarettes  . Smokeless tobacco: Never Used  . Tobacco comment: 2 ot 3 per day  Substance and Sexual Activity  . Alcohol use: Yes    Alcohol/week: 1.8 - 2.4 oz    Types: 3 - 4 Cans of beer per week    Comment: 2 to 3 day per day  . Drug use: No  . Sexual activity: Not on file  Lifestyle  . Physical  activity:    Days per week: Not on file    Minutes per session: Not on file  . Stress: Not on file  Relationships  . Social connections:    Talks on phone: Not on file    Gets together: Not on file    Attends religious service: Not on file    Active member of club or organization: Not on file    Attends meetings of clubs or organizations: Not on file    Relationship status: Not on file  . Intimate partner violence:    Fear of current or ex partner: Not on file    Emotionally abused: Not on file    Physically abused: Not on file    Forced sexual activity: Not on file  Other Topics Concern  . Not on file  Social History Narrative   He is a former heavy smoker, who is now smoking 1-2 cigarettes a day.  Prior to that he smoked for 65 years.   He walks his dog, but otherwise inactive.  He is married with 2 children-accompanied by his wife today.      He pretty much is not aware of his siblings medical history.  He has 2 brothers and a sister    Medications:   Current Outpatient Medications on File Prior to Visit  Medication Sig Dispense Refill  . aspirin EC 81 MG tablet Take 325 mg by mouth daily.     . citalopram (CELEXA) 20 MG tablet Take 20 mg by mouth daily.    . Multiple Vitamin (MULTIVITAMIN) tablet Take 1 tablet by mouth daily.    . Omega-3 Fatty Acids (FISH OIL) 1200 MG CAPS Take 1,200 mg by mouth daily.     Marland Kitchen oxyCODONE-acetaminophen (PERCOCET/ROXICET) 5-325 MG tablet Take 1-2 tablets by mouth every 6 (six) hours as needed for moderate pain. 8 tablet 0  . ramipril (ALTACE) 5 MG capsule Take 5 mg daily by mouth.    . simvastatin (ZOCOR) 40 MG tablet Take 40 mg daily by mouth.    . Vitamin D, Ergocalciferol, (DRISDOL) 50000 units CAPS capsule Take 50,000 Units 2 (two) times a week by mouth.     No current facility-administered medications on file prior to visit.     Allergies:   Allergies  Allergen Reactions  . Plavix [Clopidogrel Bisulfate]     Passed out, redness      Physical Exam General: well developed, well nourished, elderly Caucasian male seated, in no evident distress Head: head normocephalic and atraumatic.   Neck: supple with no carotid or supraclavicular bruits Cardiovascular: regular rate and rhythm, no murmurs Musculoskeletal: no deformity Skin:  no rash/petichiae Vascular:  Normal pulses all extremities  Neurologic Exam Mental Status: Awake and fully alert. Oriented to place and time. Recent and remote  memory intact. Attention span, concentration and fund of knowledge appropriate. Mood and affect appropriate.  Cranial Nerves: Fundoscopic exam reveals sharp disc margins. Pupils equal, briskly reactive to light. Extraocular movements full without nystagmus but saccadic dysmetria  On horizontal gaze.poor cooperation for visual filed testing but probably tunnel vision with greater limitation in left lower visual fields.. Visual fields full to confrontation. Hearing poor bilaterally. Facial sensation intact.  Mild left lower facial asymmetry, tongue, palate moves normally and symmetrically.  Jaw jerk is brisk. Motor: Normal bulk and tone. Normal strength in all tested extremity muscles.  Mild weakness of left grip and intrinsic hand muscles.  Orbits right over left upper extremity.  Mild weakness of left hip flexors and ankle dorsiflexors.  Unsteady while standing on left foot unsupported. Sensory.: intact to touch , pinprick , position and vibratory sensation.  Coordination: Rapid alternating movements normal in all extremities. Finger-to-nose and heel-to-shin performed accurately bilaterally. Gait and Station: Arises from chair without difficulty. Stance is normal. Gait demonstrates  Slight imbalance . Unable to heel, toe and tandem walk  .  Reflexes: 2+ and asymmetric left greater than right. Toes downgoing.   NIHSS  3 Modified Rankin  2   ASSESSMENT: 55 year Caucasian male with sudden onset vision difficulties along with gait and balance  problems likely due to a small brainstem or loss subcortical infarct not visualized on the MRI.  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  Vascular risk factors of smoking, hypertension` and hyperlipidemia.    PLAN: I had a long d/w patient and his wife about his recent stroke not visualized on MRI but abnormal MRI showing several old infarcts, risk for recurrent stroke/TIAs, personally independently reviewed imaging studies and stroke evaluation results and answered questions.Continue aspirin 325 mg daily  for secondary stroke prevention and maintain strict control of hypertension with blood pressure goal below 130/90, diabetes with hemoglobin A1c goal below 6.5% and lipids with LDL cholesterol goal below 70 mg/dL. I also advised the patient to eat a healthy diet with plenty of whole grains, cereals, fruits and vegetables, exercise regularly and maintain ideal body weight.  I recommend we check CT angiogram of the brain and neck and outpatient physical and occupational therapy for improvement of gait.  Check visual field perimetry at upcoming appointment with ophthalmologist.  I have counseled the patient to quit smoking completely and he is agreeable.  Greater than 50% time during this 45-minute consultation visit was spent on counseling and coordination of care about his stroke and answering questions.  Followup in the future with me in 2 months or call earlier if necessary Delia Heady, MD  [facility] [address] [place], [place] [address]  Phone [phone] Fax [phone] Note: This document was prepared with digital dictation and possible smart phrase technology. Any transcriptional errors that result from this process are unintentional.

## 2018-03-17 NOTE — Patient Instructions (Signed)
I had a long d/w patient and his wife about his recent stroke not visualized on MRI but abnormal MRI showing several old infarcts, risk for recurrent stroke/TIAs, personally independently reviewed imaging studies and stroke evaluation results and answered questions.Continue aspirin 325 mg daily  for secondary stroke prevention and maintain strict control of hypertension with blood pressure goal below 130/90, diabetes with hemoglobin A1c goal below 6.5% and lipids with LDL cholesterol goal below 70 mg/dL. I also advised the patient to eat a healthy diet with plenty of whole grains, cereals, fruits and vegetables, exercise regularly and maintain ideal body weight.  I recommend we check CT angiogram of the brain and neck and outpatient physical and occupational therapy for improvement of gait.  Check visual field perimetry at upcoming appointment with ophthalmologist.  I have counseled the patient to quit smoking completely and he is agreeable.  Followup in the future with me in 2 months or call earlier if necessary   Stroke Prevention Some medical conditions and behaviors are associated with a higher chance of having a stroke. You can help prevent a stroke by making nutrition, lifestyle, and other changes, including managing any medical conditions you may have. What nutrition changes can be made?  Eat healthy foods. You can do this by: ? Choosing foods high in fiber, such as fresh fruits and vegetables and whole grains. ? Eating at least 5 or more servings of fruits and vegetables a day. Try to fill half of your plate at each meal with fruits and vegetables. ? Choosing lean protein foods, such as lean cuts of meat, poultry without skin, fish, tofu, beans, and nuts. ? Eating low-fat dairy products. ? Avoiding foods that are high in salt (sodium). This can help lower blood pressure. ? Avoiding foods that have saturated fat, trans fat, and cholesterol. This can help prevent high cholesterol. ? Avoiding  processed and premade foods.  Follow your health care provider's specific guidelines for losing weight, controlling high blood pressure (hypertension), lowering high cholesterol, and managing diabetes. These may include: ? Reducing your daily calorie intake. ? Limiting your daily sodium intake to 1,500 milligrams (mg). ? Using only healthy fats for cooking, such as olive oil, canola oil, or sunflower oil. ? Counting your daily carbohydrate intake. What lifestyle changes can be made?  Maintain a healthy weight. Talk to your health care provider about your ideal weight.  Get at least 30 minutes of moderate physical activity at least 5 days a week. Moderate activity includes brisk walking, biking, and swimming.  Do not use any products that contain nicotine or tobacco, such as cigarettes and e-cigarettes. If you need help quitting, ask your health care provider. It may also be helpful to avoid exposure to secondhand smoke.  Limit alcohol intake to no more than 1 drink a day for nonpregnant women and 2 drinks a day for men. One drink equals 12 oz of beer, 5 oz of wine, or 1 oz of hard liquor.  Stop any illegal drug use.  Avoid taking birth control pills. Talk to your health care provider about the risks of taking birth control pills if: ? You are over 57 years old. ? You smoke. ? You get migraines. ? You have ever had a blood clot. What other changes can be made?  Manage your cholesterol levels. ? Eating a healthy diet is important for preventing high cholesterol. If cholesterol cannot be managed through diet alone, you may also need to take medicines. ? Take any prescribed medicines  to control your cholesterol as told by your health care provider.  Manage your diabetes. ? Eating a healthy diet and exercising regularly are important parts of managing your blood sugar. If your blood sugar cannot be managed through diet and exercise, you may need to take medicines. ? Take any prescribed  medicines to control your diabetes as told by your health care provider.  Control your hypertension. ? To reduce your risk of stroke, try to keep your blood pressure below 130/80. ? Eating a healthy diet and exercising regularly are an important part of controlling your blood pressure. If your blood pressure cannot be managed through diet and exercise, you may need to take medicines. ? Take any prescribed medicines to control hypertension as told by your health care provider. ? Ask your health care provider if you should monitor your blood pressure at home. ? Have your blood pressure checked every year, even if your blood pressure is normal. Blood pressure increases with age and some medical conditions.  Get evaluated for sleep disorders (sleep apnea). Talk to your health care provider about getting a sleep evaluation if you snore a lot or have excessive sleepiness.  Take over-the-counter and prescription medicines only as told by your health care provider. Aspirin or blood thinners (antiplatelets or anticoagulants) may be recommended to reduce your risk of forming blood clots that can lead to stroke.  Make sure that any other medical conditions you have, such as atrial fibrillation or atherosclerosis, are managed. What are the warning signs of a stroke? The warning signs of a stroke can be easily remembered as BEFAST.  B is for balance. Signs include: ? Dizziness. ? Loss of balance or coordination. ? Sudden trouble walking.  E is for eyes. Signs include: ? A sudden change in vision. ? Trouble seeing.  F is for face. Signs include: ? Sudden weakness or numbness of the face. ? The face or eyelid drooping to one side.  A is for arms. Signs include: ? Sudden weakness or numbness of the arm, usually on one side of the body.  S is for speech. Signs include: ? Trouble speaking (aphasia). ? Trouble understanding.  T is for time. ? These symptoms may represent a serious problem that is  an emergency. Do not wait to see if the symptoms will go away. Get medical help right away. Call your local emergency services (911 in the [place] Do not drive yourself to the hospital.  Other signs of stroke may include: ? A sudden, severe headache with no known cause. ? Nausea or vomiting. ? Seizure.  Where to find more information: For more information, visit:  American Stroke Association: www.strokeassociation.org  National Stroke Association: www.stroke.org  Summary  You can prevent a stroke by eating healthy, exercising, not smoking, limiting alcohol intake, and managing any medical conditions you may have.  Do not use any products that contain nicotine or tobacco, such as cigarettes and e-cigarettes. If you need help quitting, ask your health care provider. It may also be helpful to avoid exposure to secondhand smoke.  Remember BEFAST for warning signs of stroke. Get help right away if you or a loved one has any of these signs. This information is not intended to replace advice given to you by your health care provider. Make sure you discuss any questions you have with your health care provider. Document Released: 12/03/2004 Document Revised: 12/01/2016 Document Reviewed: 12/01/2016 Elsevier Interactive Patient Education  [employer].

## 2018-03-17 NOTE — Telephone Encounter (Signed)
[employer] medicare order sent to [facility]. They will obtain the auth and will reach out to the pt to schedule.  °

## 2018-04-08 ENCOUNTER — Ambulatory Visit
Admission: RE | Admit: 2018-04-08 | Discharge: 2018-04-08 | Disposition: A | Discharge status: Home / Self Care | Payer: Medicare HMO | Source: Ambulatory Visit | Attending: Neurology | Admitting: Neurology

## 2018-04-08 DIAGNOSIS — H53483 Generalized contraction of visual field, bilateral: Secondary | ICD-10-CM

## 2018-04-08 DIAGNOSIS — R260 Ataxic gait: Secondary | ICD-10-CM

## 2018-04-08 MED ORDER — IOPAMIDOL (ISOVUE-370) INJECTION 76%
75.0000 mL | Freq: Once | INTRAVENOUS | Status: AC | PRN
  Administered 2018-04-08: 75 mL via INTRAVENOUS

## 2018-04-12 ENCOUNTER — Telehealth: Payer: Self-pay

## 2018-04-12 NOTE — Telephone Encounter (Signed)
Notes recorded by Hildred AlaminMurrell, Tanai Bouler Y, RN on 04/12/2018 at 3:37 PM EDT Rn spoke with wife about Ct head neck test. Rn stated the neck and brain do not show any major vessel blockage or significant narrowing. There is evidence of old strokes seen on the right brain towards the back but no new or worrisome finding. The wife verbalized understanding. ------

## 2018-04-12 NOTE — Telephone Encounter (Signed)
-----   Message from Micki RileyPramod S Sethi, MD sent at 04/12/2018 10:26 AM EDT ----- Joneen RoachKindly inform the patient that CT angiogram of the neck and brain do not show any major vessel blockage or significant narrowing.  There is evidence of old strokes seen on the right brain towards the back but no new or worrisome finding.

## 2018-05-23 ENCOUNTER — Ambulatory Visit: Payer: Medicare HMO | Admitting: Neurology

## 2018-05-23 ENCOUNTER — Encounter: Payer: Self-pay | Admitting: Neurology

## 2018-05-23 VITALS — BP 104/63 | HR 55 | Wt 219.8 lb

## 2018-05-23 DIAGNOSIS — I6990 Unspecified sequelae of unspecified cerebrovascular disease: Secondary | ICD-10-CM

## 2018-05-23 NOTE — Progress Notes (Signed)
[facility] [address] [place]. [place] [address] [phone]       OFFICE FOLLOW UP VISIT NOTE  [name] Date of Birth:  June 23, 1946 Medical Record Number:  [id number]   Referring MD:  Caryl Comes Reason for Referral:  Stroke  [id number] Consult 03/17/2018 : Mr George Salazar is a 21 year Caucasian male seen today for first office consultation visit following possible stroke in March 2019.  History is obtained from the patient, his wife and review of accompanying medical records.  I have personally reviewed his imaging films in canopy PACS patient presented to [facility] on 01/14/2018 with a 2-week history.  Of dysarthria, difficulty ambulating with poor balance as well as bilateral peripheral vision loss which she called tunnel vision.  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  Work-up at [facility] included an MRI scan of the brain which I personally reviewed which did not show any acute stroke but showed multiple chronic cortical and subcortical infarcts in the high right frontal and parietal lobes as compared with previous MRI from 2008 they appear to have increased.  Carotid ultrasound was also done on 01/16/2017 which showed less than 50% bilateral carotid stenosis.  Intracranial vascular imaging was not done.  Transthoracic echo was apparently done and was unremarkable but I do not have the reports to review.  Patient has history of allergy with Plavix and and developed a rash in the past.  Hence he was continued on aspirin.  Patient denies any prior history of atrial fibrillation, cardiac arrhythmias.  The patient continued to have mild balance difficulties and restricted peripheral vision upon discharge.  He has seen ophthalmologist Dr. Betsy Salazar in [place] but I do not have his office notes for my review today.  Patient states  his speech is improved.  His balance still remains poor.  He has not been referred to physical occupational therapy yet.  He continues to smoke but is willing to quit smoking.  He does have a remote history of stroke 10 years ago but is unable to give me any details.  But looking at his MRI films I suspect he had right brain subcortical infarcts and has has residual mild left-sided weakness from that.  The patient has not been using a cane or a walker for assistance and has to hold onto objects to walk to avoid falling down.  Fortunately he has had no major falls or injuries.  He denies any emotional lability, bladder or bowel control problems. Update 05/23/2018 :  He returns for  follow-up after last visit 2 months ago. He states he is doing well and has had no stroke or TIA symptoms. He has cut back smoking to 3-4 cigarettes a day but has not quit completely yet. He did undergo CT angiogram of the brain and neck on 04/08/18 which I personally reviewed and shows only mild 40% right ICA and mild cavernous carotid stenosis. Patient stated he is tolerating Zocor well without muscle aches and pains. His tolerate aspirin without bruising or bleeding or upset stomach. She states his blood pressure is running on the low side and today it is 104/60. He has no new complaints. The patient did go and see Dr. Georgann Housekeeper ophthalmologist who has noticed constricted visual fields bilaterally but no stroke related visual field defect. The etiology for this visual field constriction is unclear. The patient states that  he does insecticide daily and I Ihave asked him to stop doing so and to look at the contents to see if this can be toxic to the optic nerves. ROS:   14 system review of systems is positive for blurred vision, loss of vision, gait imbalance, dizziness, impotence, joint pain, runny nose, hearing loss and all other systems negative  PMH:  Past Medical History:  Diagnosis Date  . 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 Cigarette smoker 10/28/2017  . Hyperlipidemia   . Hypertension   . Lower back pain   . OA (osteoarthritis)   . Stroke [facility](HCC)    Unclear of details    Social History:  Social History   Socioeconomic History  . Marital status: Married    Spouse name: Not on file  . Number of children: 2  . Years of education: Not on file  . Highest education level: Not on file  Occupational History  . Not on file  Social Needs  . Financial resource strain: Not on file  . Food insecurity:    Worry: Not on file    Inability: Not on file  . Transportation needs:    Medical: Not on file    Non-medical: Not on file  Tobacco Use  . Smoking status: Current Some Day Smoker    Packs/day: 0.25    Types: Cigarettes  . Smokeless tobacco: Never Used  . Tobacco comment: 2 ot 3 per day  Substance and Sexual Activity  . Alcohol use: Yes    Alcohol/week: 1.8 - 2.4 oz    Types: 3 - 4 Cans of beer per week    Comment: 2 to 3 day per day  . Drug use: No  . Sexual activity: Not on file  Lifestyle  . Physical activity:    Days per week: Not on file    Minutes per session: Not on file  . Stress: Not on file  Relationships  . Social connections:    Talks on phone: Not on file    Gets together: Not on file    Attends religious service: Not on file    Active member of club or organization: Not on file    Attends meetings of clubs or organizations: Not on file    Relationship status: Not on file  . Intimate partner violence:    Fear of current or ex partner: Not on file    Emotionally abused: Not on file    Physically abused: Not on file    Forced sexual activity: Not on file  Other Topics Concern  . Not on file  Social History Narrative   He is a former heavy smoker, who is now smoking 1-2 cigarettes a day.  Prior to that he smoked for 65 years.   He walks his dog, but otherwise inactive.  He is married with 2 children-accompanied by his wife today.      He pretty much is  not aware of his siblings medical history.  He has 2 brothers and a sister    Medications:   Current Outpatient Medications on File Prior to Visit  Medication Sig Dispense Refill  . aspirin EC 81 MG tablet Take 325 mg by mouth daily.     . citalopram (CELEXA) 20 MG tablet Take 20 mg by mouth daily.    Marland Kitchen. HYDROcodone-acetaminophen (NORCO) 10-325 MG tablet     . Multiple Vitamin (MULTIVITAMIN) tablet Take 1 tablet by mouth daily.    .Marland Kitchen  Omega-3 Fatty Acids (FISH OIL) 1200 MG CAPS Take 1,200 mg by mouth daily.     Marland Kitchen oxyCODONE-acetaminophen (PERCOCET/ROXICET) 5-325 MG tablet Take 1-2 tablets by mouth every 6 (six) hours as needed for moderate pain. 8 tablet 0  . ramipril (ALTACE) 5 MG capsule Take 5 mg daily by mouth.    . simvastatin (ZOCOR) 40 MG tablet Take 40 mg daily by mouth.    . Vitamin D, Ergocalciferol, (DRISDOL) 50000 units CAPS capsule Take 50,000 Units 2 (two) times a week by mouth.     No current facility-administered medications on file prior to visit.     Allergies:   Allergies  Allergen Reactions  . Plavix [Clopidogrel Bisulfate]     Passed out, redness     Physical Exam General: well developed, well nourished, elderly Caucasian male seated, in no evident distress Head: head normocephalic and atraumatic.   Neck: supple with no carotid or supraclavicular bruits Cardiovascular: regular rate and rhythm, no murmurs Musculoskeletal: no deformity Skin:  no rash/petichiae Vascular:  Normal pulses all extremities  Neurologic Exam Mental Status: Awake and fully alert. Oriented to place and time. Recent and remote memory intact. Attention span, concentration and fund of knowledge appropriate. Mood and affect appropriate.  Cranial Nerves: Fundoscopic exam reveals sharp disc margins. Pupils equal, briskly reactive to light. Extraocular movements full without nystagmus but saccadic dysmetria  On horizontal gaze.poor cooperation for visual filed testing but probably tunnel vision  with greater limitation in left lower visual fields.. Visual fields full to confrontation. Hearing poor bilaterally. Facial sensation intact.  Mild left lower facial asymmetry, tongue, palate moves normally and symmetrically.  Jaw jerk is brisk. Motor: Normal bulk and tone. Normal strength in all tested extremity muscles.  Mild weakness of left grip and intrinsic hand muscles.  Orbits right over left upper extremity.  Mild weakness of left hip flexors and ankle dorsiflexors.  Unsteady while standing on left foot unsupported. Sensory.: intact to touch , pinprick , position and vibratory sensation.  Coordination: Rapid alternating movements normal in all extremities. Finger-to-nose and heel-to-shin performed accurately bilaterally. Gait and Station: Arises from chair without difficulty. Stance is normal. Gait demonstrates  Slight imbalance . Unable to heel, toe and tandem walk  .  Reflexes: 2+ and asymmetric left greater than right. Toes downgoing.      ASSESSMENT: 31 year Caucasian male with sudden onset vision difficulties along with gait and balance problems likely due to a small brainstem or loss subcortical infarct not visualized on the MRI.  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  Vascular risk factors of smoking, hypertension` and hyperlipidemia.    PLAN: I had a long d/w patient and his wife about his remote strokes, risk for recurrent stroke/TIAs, personally independently reviewed imaging studies and stroke evaluation results and answered questions.Continue aspirin 325 mg daily  for secondary stroke prevention and maintain strict control of hypertension with blood pressure goal below 130/90, diabetes with hemoglobin A1c goal below 6.5% and lipids with LDL cholesterol goal below 70 mg/dL. I also advised the patient to eat a healthy diet with plenty of whole grains, cereals, fruits and  vegetables, exercise regularly and maintain ideal body weight. I again counseled the patient to quit smoking completely and he stated he will try. We also discussed fall and safety prevention precautions. Followup in the future with my nurse practitioner Shanda Bumps in 6 months or call earlier if necessary. Greater than 50%  time during this 25-minute  visit was spent on counseling and coordination of care about his stroke and answering questions.    Delia Heady, MD  [facility] [address] [place], [place] [address]  Phone [phone] Fax [phone] Note: This document was prepared with digital dictation and possible smart phrase technology. Any transcriptional errors that result from this process are unintentional.

## 2018-05-23 NOTE — Patient Instructions (Signed)
I had a long d/w patient and his wife about his remote strokes, risk for recurrent stroke/TIAs, personally independently reviewed imaging studies and stroke evaluation results and answered questions.Continue aspirin 325 mg daily  for secondary stroke prevention and maintain strict control of hypertension with blood pressure goal below 130/90, diabetes with hemoglobin A1c goal below 6.5% and lipids with LDL cholesterol goal below 70 mg/dL. I also advised the patient to eat a healthy diet with plenty of whole grains, cereals, fruits and vegetables, exercise regularly and maintain ideal body weight. I again counseled the patient to quit smoking completely and he stated he will try. We also discussed fall and safety prevention precautions. Followup in the future with my nurse practitioner Shanda BumpsJessica in 6 months or call earlier if necessary.

## 2018-07-30 IMAGING — NM NM MISC PROCEDURE
7 series · 42 of 42 positions shown · non-contrast
Comparison: none

[Series 1: wbr_r-proj_st wbr rest · 6.40mm/px · 6 of 64 frames shown]
[frame 6/64]
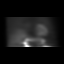
[frame 16/64]
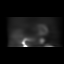
[frame 27/64]
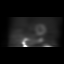
[frame 38/64]
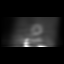
[frame 48/64]
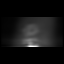
[frame 59/64]
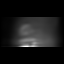

[Series 1: wbr rest · 6.40mm/px · 6 of 64 frames shown]
[frame 6/64]
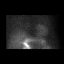
[frame 16/64]
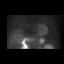
[frame 27/64]
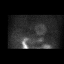
[frame 38/64]
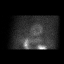
[frame 48/64]
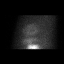
[frame 59/64]
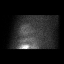

[Series 2: stress sax · 6.4mm · 6.40mm/px · 6 of 20 frames shown]
[frame 2/20]
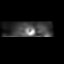
[frame 5/20]
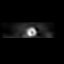
[frame 9/20]
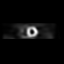
[frame 12/20]
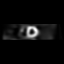
[frame 15/20]
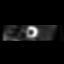
[frame 19/20]
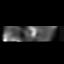

[Series 2: wbr stress-gsp · 6.40mm/px · 6 of 510 frames shown]
[frame 43/510  full-range]
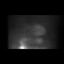
[frame 128/510  full-range]
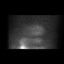
[frame 213/510  full-range]
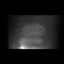
[frame 298/510  full-range]
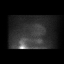
[frame 383/510  full-range]
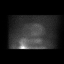
[frame 468/510  full-range]
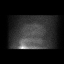

[Series 2: wbr_s-proj_st wbr stress-gsp · 6.40mm/px · 6 of 512 frames shown]
[frame 43/512]
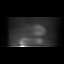
[frame 128/512]
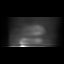
[frame 214/512]
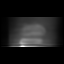
[frame 299/512]
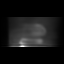
[frame 384/512]
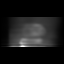
[frame 470/512]
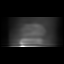

[Series 3: wbr stress-sum-em · 6.40mm/px · 6 of 64 frames shown]
[frame 6/64]
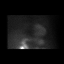
[frame 16/64]
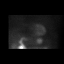
[frame 27/64]
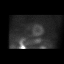
[frame 38/64]
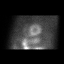
[frame 48/64]
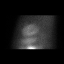
[frame 59/64]
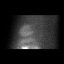

[Series 3: wbr_s-proj_st wbr stress-sum-em · 6.40mm/px · 6 of 64 frames shown]
[frame 6/64]
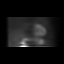
[frame 16/64]
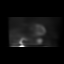
[frame 27/64]
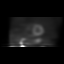
[frame 38/64]
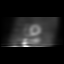
[frame 48/64]
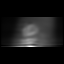
[frame 59/64]
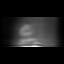

[42 of 42 positions shown; findings below may reference images not displayed]

Canned report from images found in remote index.

Refer to host system for actual result text.

## 2018-09-13 ENCOUNTER — Telehealth: Payer: Self-pay | Admitting: Neurology

## 2018-09-13 NOTE — Telephone Encounter (Signed)
Tried to call Internal medicine could not leave vm will call back.

## 2018-09-13 NOTE — Telephone Encounter (Signed)
[email or web address] is asking for a call from RN to discuss the management of pt's medications.  Please call [phone]

## 2018-09-13 NOTE — Telephone Encounter (Signed)
RN call Tylina at [facility]. Tylina stated Dr. Ardelle Park wants to know if pt could be consider for Aspirin aggrenox or plavix. RN stated pt was last seen 05/2018 and no phone calls have been made from the wife about pt having more TIA. Rn stated per epic no hospital ED visits or admissions are in the system or care everywhere that he has been treated for this.George Salazar stated the wife is stating this to Dr. Ardelle Park. RN stated more information is needed to see if its something else going on neurological happening with pt. Rn stated the wife needs to call our office for concerns. George Salazar stated she will speak with Dr. Ardelle Park and call us back if pt needs to be seen sooner.

## 2018-11-28 ENCOUNTER — Ambulatory Visit: Payer: Medicare HMO | Admitting: Adult Health

## 2018-11-28 ENCOUNTER — Encounter: Payer: Self-pay | Admitting: Adult Health

## 2018-11-28 VITALS — BP 119/69 | HR 69 | Ht 73.0 in | Wt 219.4 lb

## 2018-11-28 DIAGNOSIS — H53483 Generalized contraction of visual field, bilateral: Secondary | ICD-10-CM

## 2018-11-28 DIAGNOSIS — I10 Essential (primary) hypertension: Secondary | ICD-10-CM

## 2018-11-28 DIAGNOSIS — Z8673 Personal history of transient ischemic attack (TIA), and cerebral infarction without residual deficits: Secondary | ICD-10-CM | POA: Diagnosis not present

## 2018-11-28 DIAGNOSIS — E785 Hyperlipidemia, unspecified: Secondary | ICD-10-CM

## 2018-11-28 DIAGNOSIS — I69319 Unspecified symptoms and signs involving cognitive functions following cerebral infarction: Secondary | ICD-10-CM

## 2018-11-28 NOTE — Progress Notes (Signed)
[facility] [address] [place]. [place] [address] [phone]       OFFICE FOLLOW UP VISIT NOTE  [name] Date of Birth:  1946-01-19 Medical Record Number:  [id number]   Referring MD:  Caryl Comes Reason for Referral:  Stroke  Chief Complaint  Patient presents with  . Follow-up    6 month follow up. Wife present. Treatment room. Patient's wife mentioned that his speech has gotten worse. She stated that when he gets up in the morning it worse than other days.      HPI:  Interval history 11/28/2018: Patient is being seen today for 66-month follow-up visit for possible stroke in 01/2018 and is accompanied by his wife.  His PCP office did call on 09/13/2018 for possible consideration of changing aspirin to Aggrenox or Plavix due to patient reported TIAs.  Per phone note, additional information regarding these reported TIAs were not documented in epic system.  Office did not receive additional information regarding this.  It was recommended for the wife to call office with these concerns but does not appear as this was done. Patient did present to ED on 11/18/2018 after losing consciousness resulting in a fall.  Per report, EMS found him to be hypotensive and stabilized after normal saline.  CT head negative for acute infarct.  He was diagnosed with orthostatic hypotension likely due to inadequate fluid intake and also had recently restarted ramipril which was discontinued prior to hospital discharge.  Other medication use including Norco which was recommended to limit use along with as needed Xanax which was discontinued.  Patient has been stable since that time.  Wife is concerned regarding reported worsening of speech over the past year along with worsening of memory/cognition.  She describes worsening as waxing/waning of symptoms but not necessarily continued worsening over time.  He also continues to have balance difficulties with continued mild left  hemiparesis and tunnel vision.  His wife states that he was evaluated by ophthalmology and per wife, ophthalmologist stated visual deficits are neurological.  He has since stopped using insecticide daily which he was spraying in his shed.  He has also completely quit smoking on 11/09/2018.  He continues on aspirin 81 mg without side effects of bleeding or bruising.  Continues on simvastatin without side effects myalgias.  Blood pressure today satisfactory 119/69.  No further concerns at this time.   Update 05/23/2018 PS: He returns for  follow-up after last visit 2 months ago. He states he is doing well and has had no stroke or TIA symptoms. He has cut back smoking to 3-4 cigarettes a day but has not quit completely yet. He did undergo CT angiogram of the brain and neck on 04/08/18 which I personally reviewed and shows only mild 40% right ICA and mild cavernous carotid stenosis. Patient stated he is tolerating Zocor well without muscle aches and pains. His tolerate aspirin without bruising or bleeding or upset stomach. She states his blood pressure is running on the low side and today it is 104/60. He has no new complaints. The patient did go and see Dr. Georgann Housekeeper ophthalmologist who has noticed constricted visual fields bilaterally but no stroke related visual field defect. The etiology for this visual field constriction is unclear. The patient states that he does insecticide daily and I Ihave asked him to stop doing so and to look at the contents to see if this can be toxic to the optic nerves.  Initial Consult 03/17/2018 : Mr  George Salazar is a [age] year Caucasian male seen today for first office consultation visit following possible stroke in March 2019.  History is obtained from the patient, his wife and review of accompanying medical records.  I have personally reviewed his imaging films in canopy PACS patient presented to [facility] on 01/14/2018 with a 2-week history.  Of dysarthria, difficulty ambulating  with poor balance as well as bilateral peripheral vision loss which she called tunnel vision.  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  Work-up at [facility] included an MRI scan of the brain which I personally reviewed which did not show any acute stroke but showed multiple chronic cortical and subcortical infarcts in the high right frontal and parietal lobes as compared with previous MRI from 2008 they appear to have increased.  Carotid ultrasound was also done on 01/16/2017 which showed less than 50% bilateral carotid stenosis.  Intracranial vascular imaging was not done.  Transthoracic echo was apparently done and was unremarkable but I do not have the reports to review.  Patient has history of allergy with Plavix and and developed a rash in the past.  Hence he was continued on aspirin.  Patient denies any prior history of atrial fibrillation, cardiac arrhythmias.  The patient continued to have mild balance difficulties and restricted peripheral vision upon discharge.  He has seen ophthalmologist Dr. Betsy PriesHanley in [place] but I do not have his office notes for my review today.  Patient states his speech is improved.  His balance still remains poor.  He has not been referred to physical occupational therapy yet.  He continues to smoke but is willing to quit smoking.  He does have a remote history of stroke 10 years ago but is unable to give me any details.  But looking at his MRI films I suspect he had right brain subcortical infarcts and has has residual mild left-sided weakness from that.  The patient has not been using a cane or a walker for assistance and has to hold onto objects to walk to avoid falling down.  Fortunately he has had no major falls or injuries.  He denies any emotional lability, bladder or bowel control problems.  ROS:   14 system review of systems is positive  for runny nose, loss of vision, blurred vision, cough, heat intolerance, frequency of urination, back pain, walking difficulty, dizziness, speech difficulty and passing out  PMH:  Past Medical History:  Diagnosis Date  . 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 Cigarette smoker 10/28/2017  . Hyperlipidemia   . Hypertension   . Lower back pain   . OA (osteoarthritis)   . Stroke [facility](HCC)    Unclear of details    Social History:  Social History   Socioeconomic History  . Marital status: Married    Spouse name: Not on file  . Number of children: 2  . Years of education: Not on file  . Highest education level: Not on file  Occupational History  . Not on file  Social Needs  . Financial resource strain: Not on file  . Food insecurity:    Worry: Not on file    Inability: Not on file  . Transportation needs:    Medical: Not on file    Non-medical: Not on file  Tobacco Use  . Smoking status: Current Some Day Smoker    Packs/day: 0.25  Types: Cigarettes  . Smokeless tobacco: Never Used  . Tobacco comment: 2 ot 3 per day  Substance and Sexual Activity  . Alcohol use: Yes    Alcohol/week: 3.0 - 4.0 standard drinks    Types: 3 - 4 Cans of beer per week    Comment: 2 to 3 day per day  . Drug use: No  . Sexual activity: Not on file  Lifestyle  . Physical activity:    Days per week: Not on file    Minutes per session: Not on file  . Stress: Not on file  Relationships  . Social connections:    Talks on phone: Not on file    Gets together: Not on file    Attends religious service: Not on file    Active member of club or organization: Not on file    Attends meetings of clubs or organizations: Not on file    Relationship status: Not on file  . Intimate partner violence:    Fear of current or ex partner: Not on file    Emotionally abused: Not on file    Physically abused: Not on file    Forced sexual activity: Not on file  Other Topics Concern  . Not on file    Social History Narrative   He is a former heavy smoker, who is now smoking 1-2 cigarettes a day.  Prior to that he smoked for 65 years.   He walks his dog, but otherwise inactive.  He is married with 2 children-accompanied by his wife today.      He pretty much is not aware of his siblings medical history.  He has 2 brothers and a sister    Medications:   Current Outpatient Medications on File Prior to Visit  Medication Sig Dispense Refill  . aspirin EC 81 MG tablet Take 325 mg by mouth daily.     . citalopram (CELEXA) 20 MG tablet Take 20 mg by mouth daily.    Marland Kitchen HYDROcodone-acetaminophen (NORCO) 10-325 MG tablet     . Multiple Vitamin (MULTIVITAMIN) tablet Take 1 tablet by mouth daily.    . Omega-3 Fatty Acids (FISH OIL) 1200 MG CAPS Take 1,200 mg by mouth daily.     Marland Kitchen oxyCODONE-acetaminophen (PERCOCET/ROXICET) 5-325 MG tablet Take 1-2 tablets by mouth every 6 (six) hours as needed for moderate pain. 8 tablet 0  . ramipril (ALTACE) 5 MG capsule Take 5 mg daily by mouth.    . simvastatin (ZOCOR) 40 MG tablet Take 40 mg daily by mouth.    . Vitamin D, Ergocalciferol, (DRISDOL) 50000 units CAPS capsule Take 50,000 Units 2 (two) times a week by mouth.     No current facility-administered medications on file prior to visit.     Allergies:   Allergies  Allergen Reactions  . Plavix [Clopidogrel Bisulfate]     Passed out, redness     Physical Exam General: well developed, well nourished, pleasant elderly Caucasian male seated, in no evident distress Head: head normocephalic and atraumatic.   Neck: supple with no carotid or supraclavicular bruits Cardiovascular: regular rate and rhythm, no murmurs Musculoskeletal: no deformity Skin:  no rash/petichiae Vascular:  Normal pulses all extremities  Neurologic Exam Mental Status: Awake and fully alert. Mild Dysarthria. Oriented to place and time. Serial additions difficulty; Animal naming 11; recall 2/3; clock drawing: Mood and affect  appropriate.  Cranial Nerves: Pupils equal, briskly reactive to light. Extraocular movements full without nystagmus but saccadic dysmetria; visual fields decreased peripheral  vision but inconsistent exam. Hearing poor bilaterally. Facial sensation intact.  Mild left lower facial asymmetry, tongue, palate moves normally and symmetrically.   Motor: Normal bulk and tone. Normal strength in all tested extremity muscles except for mild decreased left hand dexterity and left hip flexor weakness Sensory.: intact to touch , pinprick , position and vibratory sensation.  Coordination: Rapid alternating movements normal in all extremities. Finger-to-nose and heel-to-shin performed accurately bilaterally. Decreased left hand dexterity.  Gait and Station: Arises from chair without difficulty. Stance is normal. Gait demonstrates  Slight imbalance . Unable to heel, toe and tandem walk  .  Reflexes: 2+ and asymmetric left greater than right. Toes downgoing.      ASSESSMENT: [age] year Caucasian male with sudden onset vision difficulties along with gait and balance problems likely due to a small brainstem or loss subcortical infarct not visualized on the MRI.  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  Vascular risk factors of smoking, hypertension` and hyperlipidemia. Patient returns today for follow up with continued cognitive deficits, tunnel vision, gait difficulties, mild left hand and hip flexor weakness and dysarthria.     PLAN: -Continue aspirin 81 mg daily and simvastatin 40 mg daily for secondary stroke prevention -f/u with PCP for HTN and HLD management -referral placed to neuro ophthomology due to continued visual deficit -Daughter believes that home speech therapy will be starting today but if a different therapy service is being started, she was advised to call office for referral for ST for  continued speech and cognitive deficits -Discussion with wife and patient regarding waxing/waning of symptoms as normal stroke recovery -Continue to stay active and maintain a healthy diet -Continue to monitor blood pressure at home -maintain strict control of hypertension with blood pressure goal below 130/90, diabetes with hemoglobin A1c goal below 6.5% and lipids with LDL cholesterol goal below 70 mg/dL. I also advised the patient to eat a healthy diet with plenty of whole grains, cereals, fruits and vegetables, exercise regularly and maintain ideal body weight. I again counseled the patient to quit smoking completely and he stated he will try. We also discussed fall and safety prevention precautions.   Follow-up in 6 months or call earlier if needed    Greater than 50% time during this 25-minute  visit was spent on counseling and coordination of care about his stroke and answering questions.     George HughJessica VanSchaick, AGNP-BC  [facility] [address] [place], [place] [id number]  Phone [phone] Fax [phone] Note: This document was prepared with digital dictation and possible smart phrase technology. Any transcriptional errors that result from this process are unintentional.

## 2018-11-28 NOTE — Patient Instructions (Addendum)
Continue aspirin 81 mg daily  and simvastatin  for secondary stroke prevention  Continue to follow up with PCP regarding cholesterol and blood pressure management   Referral placed to neuro ophthalmology for continued tunnel vision  Due to continued speech and memory concerns, recommend to do speech therapy - possibly starting today - if not, please call office and we will place order  - possibly consider neurocognitive eval if needed  Continue to monitor blood pressure at home  Maintain strict control of hypertension with blood pressure goal below 130/90, diabetes with hemoglobin A1c goal below 6.5% and cholesterol with LDL cholesterol (bad cholesterol) goal below 70 mg/dL. I also advised the patient to eat a healthy diet with plenty of whole grains, cereals, fruits and vegetables, exercise regularly and maintain ideal body weight.  Followup in the future with me in 6 months or call earlier if needed       Thank you for coming to see [place] at [facility]. I hope we have been able to provide you high quality care today.  You may receive a patient satisfaction survey over the next few weeks. We would appreciate your feedback and comments so that we may continue to improve ourselves and the health of our patients.

## 2018-11-29 NOTE — Progress Notes (Signed)
I agree with the above plan 

## 2019-01-11 ENCOUNTER — Ambulatory Visit: Payer: Medicare HMO | Admitting: Vascular Surgery

## 2019-01-11 ENCOUNTER — Other Ambulatory Visit: Payer: Self-pay

## 2019-01-11 ENCOUNTER — Other Ambulatory Visit (HOSPITAL_COMMUNITY): Payer: Self-pay | Admitting: Vascular Surgery

## 2019-01-11 ENCOUNTER — Ambulatory Visit (HOSPITAL_COMMUNITY)
Admission: RE | Admit: 2019-01-11 | Discharge: 2019-01-11 | Disposition: A | Discharge status: Home / Self Care | Payer: Medicare HMO | Source: Ambulatory Visit | Attending: Vascular Surgery | Admitting: Vascular Surgery

## 2019-01-11 ENCOUNTER — Encounter: Payer: Self-pay | Admitting: Vascular Surgery

## 2019-01-11 VITALS — BP 110/66 | HR 62 | Temp 97.5°F | Resp 20 | Ht 73.0 in | Wt 222.4 lb

## 2019-01-11 DIAGNOSIS — Z48812 Encounter for surgical aftercare following surgery on the circulatory system: Secondary | ICD-10-CM

## 2019-01-11 DIAGNOSIS — I714 Abdominal aortic aneurysm, without rupture, unspecified: Secondary | ICD-10-CM

## 2019-01-11 DIAGNOSIS — Z95828 Presence of other vascular implants and grafts: Secondary | ICD-10-CM

## 2019-01-11 NOTE — Progress Notes (Signed)
Patient name: George Salazar MRN: [id number] DOB: 24-Dec-1945 Sex: male  REASON FOR VISIT:   Follow-up after endovascular repair of 4.6 cm right common iliac artery aneurysm.    HPI:   George Salazar is a pleasant 73 y.o. male who comes in for follow-up after endovascular repair of a 4.6 cm right common iliac artery aneurysm.This patient underwent preoperative coil embolization of the right internal iliac artery and then had percutaneous endovascular aneurysm repair using 3 components on 11/18/2017.  At the time of his first follow-up visit on 12/22/2017 this showed that the aneurysm was stable in size.  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  The graft was in excellent position.  I ordered a follow-up ultrasound in 1 year.  Since I saw him last, he denies any abdominal or back pain.  He has had a change in that he seems slower than normal and is experienced some unsteady gait and visual disturbances.  He is being worked up by neurology for this.  His work-up included an MRI which suggested a small brainstem or subcortical infarcts.  He is on aspirin and is on a statin.   Past Medical History:  Diagnosis Date  . 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 Cigarette smoker 10/28/2017  . Hyperlipidemia   . Hypertension   . Lower back pain   . OA (osteoarthritis)   . Stroke [facility])    Unclear of details    Family History  Problem Relation Age of Onset  . Cancer Mother        Died at age 35  . Stroke Father 57    SOCIAL HISTORY: Social History   Tobacco Use  . Smoking status: Former Smoker    Packs/day: 0.25    Types: Cigarettes  . Smokeless tobacco: Never Used  . Tobacco comment: 2 ot 3 per day  Substance Use Topics  . Alcohol use: Yes    Alcohol/week: 3.0 - 4.0 standard drinks    Types: 3 - 4 Cans of beer per week    Comment: 2 to 3 day per day    Allergies  Allergen Reactions  . Plavix [Clopidogrel  Bisulfate]     Passed out, redness     Current Outpatient Medications  Medication Sig Dispense Refill  . aspirin 325 MG EC tablet Take 325 mg by mouth daily.    . citalopram (CELEXA) 20 MG tablet Take 20 mg by mouth daily.    Marland Kitchen HYDROcodone-acetaminophen (NORCO) 10-325 MG tablet     . Multiple Vitamin (MULTIVITAMIN) tablet Take 1 tablet by mouth daily.    Marland Kitchen oxyCODONE-acetaminophen (PERCOCET/ROXICET) 5-325 MG tablet Take 1-2 tablets by mouth every 6 (six) hours as needed for moderate pain. 8 tablet 0  . simvastatin (ZOCOR) 40 MG tablet Take 40 mg daily by mouth.    . Vitamin D, Ergocalciferol, (DRISDOL) 50000 units CAPS capsule Take 50,000 Units 2 (two) times a week by mouth.    . Omega-3 Fatty Acids (FISH OIL) 1200 MG CAPS Take 1,200 mg by mouth daily.     . ramipril (ALTACE) 5 MG capsule Take 5 mg daily by mouth.     No current facility-administered medications for this visit.     REVIEW OF SYSTEMS:  [X]  denotes positive finding, [ ]  denotes negative finding Cardiac  Comments:  Chest pain or chest pressure:    Shortness of breath upon exertion:    Short  of breath when lying flat:    Irregular heart rhythm:        Vascular    Pain in calf, thigh, or hip brought on by ambulation:    Pain in feet at night that wakes you up from your sleep:     Blood clot in your veins:    Leg swelling:         Pulmonary    Oxygen at home:    Productive cough:     Wheezing:         Neurologic    Sudden weakness in arms or legs:     Sudden numbness in arms or legs:     Sudden onset of difficulty speaking or slurred speech:    Temporary loss of vision in one eye:  x   Problems with dizziness:  x       Gastrointestinal    Blood in stool:     Vomited blood:         Genitourinary    Burning when urinating:     Blood in urine:        Psychiatric    Major depression:         Hematologic    Bleeding problems:    Problems with blood clotting too easily:        Skin    Rashes or ulcers:         Constitutional    Fever or chills:     PHYSICAL EXAM:   Vitals:   01/11/19 0937  BP: 110/66  Pulse: 62  Resp: 20  Temp: (!) 97.5 F (36.4 C)  SpO2: 96%  Weight: 222 lb 7.1 oz (100.9 kg)  Height: 6\' 1"  (1.854 m)    GENERAL: The patient is a well-nourished male, in no acute distress. The vital signs are documented above. CARDIAC: There is a regular rate and rhythm.  VASCULAR: I do not detect carotid bruits. He has palpable femoral pulses. Both feet are warm and well-perfused. PULMONARY: There is good air exchange bilaterally without wheezing or rales. ABDOMEN: Soft and non-tender with normal pitched bowel sounds.  MUSCULOSKELETAL: There are no major deformities or cyanosis. NEUROLOGIC: No focal weakness or paresthesias are detected. SKIN: There are no ulcers or rashes noted. PSYCHIATRIC: The patient has a normal affect.  DATA:    DUPLEX ABDOMINAL AORTA: I have independently interpreted his duplex of his abdominal aorta.  The maximum diameter of his aneurysm is 3.1 cm which is thus decreased in size slightly.  The right common iliac artery measures 3.0 cm in maximum diameter which is also decreased in size.  The left common iliac artery measures 1.3 cm in maximum diameter.  MEDICAL ISSUES:   STATUS POST ENDOVASCULAR ANEURYSM REPAIR: This patient underwent repair of a 4.6 cm right common iliac artery aneurysm.  He comes in for routine follow-up visit.  The aneurysm has decreased in size and now measures 3.1 cm in maximum diameter.  He does have a history of a type II endoleak which could not be well visualized on today's study.  Given that the aneurysm is decreased in size I think we can stick with a one-year follow-up visit.  However I would like to get a CT of Angie of the abdomen and pelvis given his history of a type II endoleak.  He is not a smoker.  He is on aspirin and is on statin.  He knows to call sooner if he has problems.  Waverly Ferrari [facility] [employer] [phone]

## 2019-03-13 ENCOUNTER — Telehealth: Payer: Self-pay | Admitting: Adult Health

## 2019-03-13 NOTE — Telephone Encounter (Signed)
Pt wife called in to check the status of the opthalmology referral , advised pt the referral is showing faxed in January, she reached out to Dr Jorja Loa at [facility] and they are stating they havent received anything

## 2019-03-13 NOTE — Telephone Encounter (Signed)
Thank you so much for assisting with this referral.

## 2019-03-13 NOTE — Telephone Encounter (Signed)
Message sent to [facility] in referrals to follow up on from January 2020.

## 2019-03-13 NOTE — Telephone Encounter (Signed)
Called Dr. Ermalene Searing office they didn't receive referral . I have patient scheduled now for July 24 th at 8:00 am . Dr. Daphine Deutscher is going to review notes to see if he can get him moved up. [facility] will call patient's wife if apt. Is moved up to a sooner date. I have and explained all this to patient's wife she understood. Thanks Annabelle Harman .

## 2019-06-05 ENCOUNTER — Ambulatory Visit: Payer: Medicare HMO | Admitting: Adult Health

## 2019-06-05 NOTE — Progress Notes (Deleted)
[facility] [address] [place]. [place] [id number] [phone]       OFFICE FOLLOW UP VISIT NOTE  [name] Date of Birth:  Mar 11, 1946 Medical Record Number:  [id number]   Referring MD:  George ComesShankar Salazar Reason for Referral:  Stroke  No chief complaint on file.    HPI:  06/05/2019 visit: George Salazar is a 73 year old male who is being seen today for stroke follow-up.  He has been stable from a neurological standpoint with residual deficits of ***.  Continues on aspirin and simvastatin for secondary stroke prevention without reported side effects.  Blood pressure ***.  No further concerns at this time.  History 11/28/2018: Patient is being seen today for 1563-month follow-up visit for possible stroke in 01/2018 and is accompanied by his wife.  His PCP office did call on 09/13/2018 for possible consideration of changing aspirin to Aggrenox or Plavix due to patient reported TIAs.  Per phone note, additional information regarding these reported TIAs were not documented in epic system.  Office did not receive additional information regarding this.  It was recommended for the wife to call office with these concerns but does not appear as this was done. Patient did present to ED on 11/18/2018 after losing consciousness resulting in a fall.  Per report, EMS found him to be hypotensive and stabilized after normal saline.  CT head negative for acute infarct.  He was diagnosed with orthostatic hypotension likely due to inadequate fluid intake and also had recently restarted ramipril which was discontinued prior to hospital discharge.  Other medication use including Norco which was recommended to limit use along with as needed Xanax which was discontinued.  Patient has been stable since that time.  Wife is concerned regarding reported worsening of speech over the past year along with worsening of memory/cognition.  She describes worsening as waxing/waning of symptoms but not  necessarily continued worsening over time.  He also continues to have balance difficulties with continued mild left hemiparesis and tunnel vision.  His wife states that he was evaluated by ophthalmology and per wife, ophthalmologist stated visual deficits are neurological.  He has since stopped using insecticide daily which he was spraying in his shed.  He has also completely quit smoking on 11/09/2018.  He continues on aspirin 81 mg without side effects of bleeding or bruising.  Continues on simvastatin without side effects myalgias.  Blood pressure today satisfactory 119/69.  No further concerns at this time.   Update 05/23/2018 PS: He returns for  follow-up after last visit 2 months ago. He states he is doing well and has had no stroke or TIA symptoms. He has cut back smoking to 3-4 cigarettes a day but has not quit completely yet. He did undergo CT angiogram of the brain and neck on 04/08/18 which I personally reviewed and shows only mild 40% right ICA and mild cavernous carotid stenosis. Patient stated he is tolerating Zocor well without muscle aches and pains. His tolerate aspirin without bruising or bleeding or upset stomach. She states his blood pressure is running on the low side and today it is 104/60. He has no new complaints. The patient did go and see Dr. Georgann Housekeeperobert Salazar ophthalmologist who has noticed constricted visual fields bilaterally but no stroke related visual field defect. The etiology for this visual field constriction is unclear. The patient states that he does insecticide daily and I Ihave asked him to stop doing so and to look at the contents to see if  this can be toxic to the optic nerves.  Initial Consult 03/17/2018 : Mr George Salazar is a [age] year Caucasian male seen today for first office consultation visit following possible stroke in March 2019.  History is obtained from the patient, his wife and review of accompanying medical records.  I have personally reviewed his imaging films in canopy  PACS patient presented to [facility] on [facility]/14/2018 with a 2-week history.  Of dysarthria, difficulty ambulating with poor balance as well as bilateral peripheral vision loss which she called tunnel vision.  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  Work-up at [facility] included an MRI scan of the brain which I personally reviewed which did not show any acute stroke but showed multiple chronic cortical and subcortical infarcts in the high right frontal and parietal lobes as compared with previous MRI from 2008 they appear to have increased.  Carotid ultrasound was also done on 01/16/2017 which showed less than 50% bilateral carotid stenosis.  Intracranial vascular imaging was not done.  Transthoracic echo was apparently done and was unremarkable but I do not have the reports to review.  Patient has history of allergy with Plavix and and developed a rash in the past.  Hence he was continued on aspirin.  Patient denies any prior history of atrial fibrillation, cardiac arrhythmias.  The patient continued to have mild balance difficulties and restricted peripheral vision upon discharge.  He has seen ophthalmologist Dr. Betsy [place]riesHanley in [place] but I do not have his office notes for my review today.  Patient states his speech is improved.  His balance still remains poor.  He has not been referred to physical occupational therapy yet.  He continues to smoke but is willing to quit smoking.  He does have a remote history of stroke 10 years ago but is unable to give me any details.  But looking at his MRI films I suspect he had right brain subcortical infarcts and has has residual mild left-sided weakness from that.  The patient has not been using a cane or a walker for assistance and has to hold onto objects to walk to avoid falling down.  Fortunately he has had no major falls or injuries.  He  denies any emotional lability, bladder or bowel control problems.  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   Cigarette smoker 10/28/2017   Hyperlipidemia    Hypertension    Lower back pain    OA (osteoarthritis)    Stroke (HCC)    Unclear of details    Social History:  Social History   Socioeconomic History   Marital status: Married    Spouse name: Not on file   Number of children: 2   Years of education: Not on file   Highest education level: Not on file  Occupational History   Not on file  Social Needs   Financial resource strain: Not on file   Food insecurity    Worry: Not on file    Inability: Not on file   Transportation needs    Medical: Not on file    Non-medical: Not on file  Tobacco Use  Smoking status: Former Smoker    Packs/day: 0.25    Types: Cigarettes   Smokeless tobacco: Never Used   Tobacco comment: 2 ot 3 per day  Substance and Sexual Activity   Alcohol use: Yes    Alcohol/week: 3.0 - 4.0 standard drinks    Types: 3 - 4 Cans of beer per week    Comment: 2 to 3 day per day   Drug use: No   Sexual activity: Not on file  Lifestyle   Physical activity    Days per week: Not on file    Minutes per session: Not on file   Stress: Not on file  Relationships   Social connections    Talks on phone: Not on file    Gets together: Not on file    Attends religious service: Not on file    Active member of club or organization: Not on file    Attends meetings of clubs or organizations: Not on file    Relationship status: Not on file   Intimate partner violence    Fear of current or ex partner: Not on file    Emotionally abused: Not on file     Physically abused: Not on file    Forced sexual activity: Not on file  Other Topics Concern   Not on file  Social History Narrative   He is a former heavy smoker, who is now smoking 1-2 cigarettes a day.  Prior to that he smoked for 65 years.   He walks his dog, but otherwise inactive.  He is married with 2 children-accompanied by his wife today.      He pretty much is not aware of his siblings medical history.  He has 2 brothers and a sister    Medications:   Current Outpatient Medications on File Prior to Visit  Medication Sig Dispense Refill   aspirin 325 MG EC tablet Take 325 mg by mouth daily.     citalopram (CELEXA) 20 MG tablet Take 20 mg by mouth daily.     HYDROcodone-acetaminophen (NORCO) 10-325 MG tablet      Multiple Vitamin (MULTIVITAMIN) tablet Take 1 tablet by mouth daily.     Omega-3 Fatty Acids (FISH OIL) 1200 MG CAPS Take 1,200 mg by mouth daily.      oxyCODONE-acetaminophen (PERCOCET/ROXICET) 5-325 MG tablet Take 1-2 tablets by mouth every 6 (six) hours as needed for moderate pain. 8 tablet 0   ramipril (ALTACE) 5 MG capsule Take 5 mg daily by mouth.     simvastatin (ZOCOR) 40 MG tablet Take 40 mg daily by mouth.     Vitamin D, Ergocalciferol, (DRISDOL) 50000 units CAPS capsule Take 50,000 Units 2 (two) times a week by mouth.     No current facility-administered medications on file prior to visit.     Allergies:   Allergies  Allergen Reactions   Plavix [Clopidogrel Bisulfate]     Passed out, redness     Physical Exam General: well developed, well nourished, pleasant elderly Caucasian male seated, in no evident distress Head: head normocephalic and atraumatic.   Neck: supple with no carotid or supraclavicular bruits Cardiovascular: regular rate and rhythm, no murmurs Musculoskeletal: no deformity Skin:  no rash/petichiae Vascular:  Normal pulses all extremities  Neurologic Exam Mental Status: Awake and fully alert. Mild Dysarthria. Oriented to  place and time. Serial additions difficulty; Animal naming 11; recall 2/3; clock drawing: Mood and affect appropriate.  Cranial Nerves: Pupils equal, briskly reactive to light. Extraocular movements  full without nystagmus but saccadic dysmetria; visual fields decreased peripheral vision but inconsistent exam. Hearing poor bilaterally. Facial sensation intact.  Mild left lower facial asymmetry, tongue, palate moves normally and symmetrically.   Motor: Normal bulk and tone. Normal strength in all tested extremity muscles except for mild decreased left hand dexterity and left hip flexor weakness Sensory.: intact to touch , pinprick , position and vibratory sensation.  Coordination: Rapid alternating movements normal in all extremities. Finger-to-nose and heel-to-shin performed accurately bilaterally. Decreased left hand dexterity.  Gait and Station: Arises from chair without difficulty. Stance is normal. Gait demonstrates  Slight imbalance . Unable to heel, toe and tandem walk  .  Reflexes: 2+ and asymmetric left greater than right. Toes downgoing.      ASSESSMENT: 35 year Caucasian male with sudden onset vision difficulties along with gait and balance problems likely due to a small brainstem or loss subcortical infarct not visualized on the MRI.  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  Vascular risk factors of smoking, hypertension` and hyperlipidemia. Patient returns today for follow up with continued cognitive deficits, tunnel vision, gait difficulties, mild left hand and hip flexor weakness and dysarthria.     PLAN: -Continue aspirin 81 mg daily and simvastatin 40 mg daily for secondary stroke prevention -f/u with PCP for HTN and HLD management -referral placed to neuro ophthomology due to continued visual deficit -Daughter believes that home speech therapy will be starting today but if a  different therapy service is being started, she was advised to call office for referral for ST for continued speech and cognitive deficits -Discussion with wife and patient regarding waxing/waning of symptoms as normal stroke recovery -Continue to stay active and maintain a healthy diet -Continue to monitor blood pressure at home -maintain strict control of hypertension with blood pressure goal below 130/90, diabetes with hemoglobin A1c goal below 6.5% and lipids with LDL cholesterol goal below 70 mg/dL. I also advised the patient to eat a healthy diet with plenty of whole grains, cereals, fruits and vegetables, exercise regularly and maintain ideal body weight. I again counseled the patient to quit smoking completely and he stated he will try. We also discussed fall and safety prevention precautions.   Follow-up in 6 months or call earlier if needed    Greater than 50% time during this 25-minute  visit was spent on counseling and coordination of care about his stroke and answering questions.     Venancio Poisson, AGNP-BC  [facility] [address] [place], [place] [address]  Phone [phone] Fax [phone] Note: This document was prepared with digital dictation and possible smart phrase technology. Any transcriptional errors that result from this process are unintentional.

## 2019-06-06 ENCOUNTER — Encounter: Payer: Self-pay | Admitting: Adult Health

## 2020-02-22 ENCOUNTER — Other Ambulatory Visit: Payer: Self-pay

## 2020-02-22 DIAGNOSIS — I714 Abdominal aortic aneurysm, without rupture, unspecified: Secondary | ICD-10-CM

## 2020-03-18 ENCOUNTER — Other Ambulatory Visit: Payer: Medicare Other

## 2020-03-20 ENCOUNTER — Other Ambulatory Visit (HOSPITAL_COMMUNITY): Payer: Medicare Other

## 2020-03-20 ENCOUNTER — Ambulatory Visit: Payer: Medicare Other | Admitting: Vascular Surgery

## 2020-04-23 ENCOUNTER — Other Ambulatory Visit: Payer: Self-pay | Admitting: *Deleted

## 2020-04-23 DIAGNOSIS — I714 Abdominal aortic aneurysm, without rupture, unspecified: Secondary | ICD-10-CM

## 2020-04-24 ENCOUNTER — Ambulatory Visit
Admission: RE | Admit: 2020-04-24 | Discharge: 2020-04-24 | Disposition: A | Discharge status: Home / Self Care | Payer: Medicare Other | Source: Ambulatory Visit | Attending: Vascular Surgery | Admitting: Vascular Surgery

## 2020-04-24 DIAGNOSIS — I714 Abdominal aortic aneurysm, without rupture, unspecified: Secondary | ICD-10-CM

## 2020-04-24 MED ORDER — IOPAMIDOL (ISOVUE-370) INJECTION 76%
75.0000 mL | Freq: Once | INTRAVENOUS | Status: AC | PRN
  Administered 2020-04-24: 75 mL via INTRAVENOUS

## 2020-05-01 ENCOUNTER — Ambulatory Visit (HOSPITAL_COMMUNITY)
Admission: RE | Admit: 2020-05-01 | Discharge: 2020-05-01 | Disposition: A | Discharge status: Home / Self Care | Payer: Medicare Other | Source: Ambulatory Visit | Attending: Vascular Surgery | Admitting: Vascular Surgery

## 2020-05-01 ENCOUNTER — Encounter: Payer: Self-pay | Admitting: Vascular Surgery

## 2020-05-01 ENCOUNTER — Other Ambulatory Visit: Payer: Self-pay

## 2020-05-01 ENCOUNTER — Ambulatory Visit: Payer: Medicare Other | Admitting: Vascular Surgery

## 2020-05-01 VITALS — BP 127/78 | HR 61 | Temp 97.6°F | Resp 20 | Ht 73.0 in | Wt 220.0 lb

## 2020-05-01 DIAGNOSIS — I714 Abdominal aortic aneurysm, without rupture, unspecified: Secondary | ICD-10-CM

## 2020-05-01 DIAGNOSIS — Z48812 Encounter for surgical aftercare following surgery on the circulatory system: Secondary | ICD-10-CM | POA: Diagnosis not present

## 2020-05-01 NOTE — Progress Notes (Signed)
REASON FOR VISIT:   Follow-up after endovascular aneurysm repair  MEDICAL ISSUES:   STATUS POST ENDOVASCULAR ANEURYSM REPAIR: The patient underwent repair of 4.6 cm right common iliac artery aneurysm and comes in for routine visit.  The maximum diameter of his infrarenal aorta is 3.1 cm which is unchanged compared to the study over a year ago.  The maximum diameter of his right common iliac artery aneurysm is 3 cm which also is unchanged.  Thus I think we can continue his follow-up at 1 year intervals with ultrasound.  Based on his CT scan he does have evidence of a persistent type II endoleak as long as the aneurysm is not increasing in size I do not think we need routine follow-up CT scans.  I will see him back in 1 year.  He knows to call sooner if he has problems.   HPI:   George Salazar is a pleasant 74 y.o. male who I last saw on 01/11/2019.  He had undergone repair of a 4.6 cm right common iliac artery aneurysm with preoperative coiling utilization the right internal iliac artery.  This procedure was on 11/18/2017.  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  At the time of his last visit the maximum diameter of his aneurysm was 3.1 cm.  The maximum diameter of the right common iliac artery was 3 cm.  This was over a year ago.  Since I saw him last he has had a significant stroke associated with left-sided weakness and he has lost most of his peripheral vision.  He is quite debilitated.  He denies any abdominal pain or back pain.  He is here with his daughter today.  He is not a smoker.  He is on aspirin and is on a statin.  Past Medical History:  Diagnosis Date  . 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 Cigarette smoker 10/28/2017  . Hyperlipidemia   . Hypertension   . Lower back pain   . OA (osteoarthritis)   . Stroke [facility])    Unclear of details    Family History  Problem Relation Age of Onset  . Cancer Mother        Died at age 69  .  Stroke Father 35    SOCIAL HISTORY: Social History   Tobacco Use  . Smoking status: Former Smoker    Packs/day: 0.25    Types: Cigarettes  . Smokeless tobacco: Never Used  . Tobacco comment: 2 ot 3 per day  Substance Use Topics  . Alcohol use: Yes    Alcohol/week: 3.0 - 4.0 standard drinks    Types: 3 - 4 Cans of beer per week    Comment: 2 to 3 day per day    Allergies  Allergen Reactions  . Plavix [Clopidogrel Bisulfate]     Passed out, redness     Current Outpatient Medications  Medication Sig Dispense Refill  . aspirin EC 81 MG tablet Take 81 mg by mouth daily. Swallow whole.    . cephALEXin (KEFLEX) 500 MG capsule Take 500 mg by mouth 3 (three) times daily.    . Cholecalciferol 125 MCG (5000 UT) TABS Take by mouth.    . citalopram (CELEXA) 20 MG tablet Take 20 mg by mouth daily.    . DULoxetine (CYMBALTA) 60 MG capsule Take 60 mg by mouth daily.    Marland Kitchen HYDROcodone-acetaminophen (NORCO) 10-325 MG tablet     . Multiple Vitamin (  MULTIVITAMIN) tablet Take 1 tablet by mouth daily.    . Omega-3 Fatty Acids (FISH OIL) 1200 MG CAPS Take 1,200 mg by mouth daily.     . QUEtiapine (SEROQUEL) 50 MG tablet     . simvastatin (ZOCOR) 40 MG tablet Take 40 mg daily by mouth.    . Vitamin D, Ergocalciferol, (DRISDOL) 50000 units CAPS capsule Take 50,000 Units 2 (two) times a week by mouth.     No current facility-administered medications for this visit.    REVIEW OF SYSTEMS:  [X]  denotes positive finding, [ ]  denotes negative finding Cardiac  Comments:  Chest pain or chest pressure:    Shortness of breath upon exertion:    Short of breath when lying flat:    Irregular heart rhythm:        Vascular    Pain in calf, thigh, or hip brought on by ambulation:    Pain in feet at night that wakes you up from your sleep:     Blood clot in your veins:    Leg swelling:         Pulmonary    Oxygen at home:    Productive cough:     Wheezing:         Neurologic    Sudden weakness in  arms or legs:  x   Sudden numbness in arms or legs:  x   Sudden onset of difficulty speaking or slurred speech:    Temporary loss of vision in one eye:     Problems with dizziness:         Gastrointestinal    Blood in stool:     Vomited blood:         Genitourinary    Burning when urinating:     Blood in urine:        Psychiatric    Major depression:         Hematologic    Bleeding problems:    Problems with blood clotting too easily:        Skin    Rashes or ulcers:        Constitutional    Fever or chills:     PHYSICAL EXAM:   Vitals:   05/01/20 0942  BP: 127/78  Pulse: 61  Resp: 20  Temp: 97.6 F (36.4 C)  SpO2: 99%  Weight: 220 lb (99.8 kg)  Height: 6\' 1"  (1.854 m)    GENERAL: The patient is a well-nourished male, in no acute distress. The vital signs are documented above. CARDIAC: There is a regular rate and rhythm.  VASCULAR: I do not detect carotid bruits. Both feet are warm and well perfused. PULMONARY: There is good air exchange bilaterally without wheezing or rales. ABDOMEN: Soft and non-tender with normal pitched bowel sounds.  MUSCULOSKELETAL: There are no major deformities or cyanosis. NEUROLOGIC: No focal weakness or paresthesias are detected. SKIN: There are no ulcers or rashes noted. PSYCHIATRIC: The patient has a normal affect.  DATA:    DUPLEX ABDOMINAL AORTA: I have independently interpreted the patient's duplex of his endovascular graft.  This shows his graft is patent with no evidence of endoleak.  The maximum diameter of the aorta is 3.14 cm.  The right limb measures 2.99 cm in diameter and the left limb measures 1.45 cm in diameter.  CT ABDOMEN PELVIS: I did review his CT abdomen and pelvis that was done on 04/24/2020.  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  However the aneurysm measured 2.7 cm in maximum diameter and there has been no change in the size of his right common iliac artery or abdominal aortic  aneurysm.  The left common iliac artery measured 1.7 cm in maximum diameter. Waverly [facility] [phone]
